# Patient Record
Sex: Female | Born: 1935 | Race: White | Hispanic: No | Marital: Married | Smoking: Former smoker
Health system: Southern US, Community
[De-identification: ages and names within clinical notes are randomized; demographics above are authoritative.]

## PROBLEM LIST (undated history)

## (undated) DIAGNOSIS — I10 Essential (primary) hypertension: Secondary | ICD-10-CM

## (undated) DIAGNOSIS — B019 Varicella without complication: Secondary | ICD-10-CM

## (undated) DIAGNOSIS — Z87440 Personal history of urinary (tract) infections: Secondary | ICD-10-CM

## (undated) DIAGNOSIS — E079 Disorder of thyroid, unspecified: Secondary | ICD-10-CM

## (undated) DIAGNOSIS — M8580 Other specified disorders of bone density and structure, unspecified site: Secondary | ICD-10-CM

## (undated) DIAGNOSIS — G4700 Insomnia, unspecified: Secondary | ICD-10-CM

## (undated) DIAGNOSIS — M87029 Idiopathic aseptic necrosis of unspecified humerus: Secondary | ICD-10-CM

## (undated) DIAGNOSIS — J301 Allergic rhinitis due to pollen: Secondary | ICD-10-CM

## (undated) DIAGNOSIS — M4120 Other idiopathic scoliosis, site unspecified: Secondary | ICD-10-CM

## (undated) DIAGNOSIS — I2699 Other pulmonary embolism without acute cor pulmonale: Secondary | ICD-10-CM

## (undated) DIAGNOSIS — F5102 Adjustment insomnia: Secondary | ICD-10-CM

## (undated) DIAGNOSIS — F23 Brief psychotic disorder: Secondary | ICD-10-CM

## (undated) DIAGNOSIS — F419 Anxiety disorder, unspecified: Secondary | ICD-10-CM

## (undated) HISTORY — DX: Varicella without complication: B01.9

## (undated) HISTORY — PX: APPENDECTOMY: SHX54

## (undated) HISTORY — DX: Allergic rhinitis due to pollen: J30.1

## (undated) HISTORY — PX: OTHER SURGICAL HISTORY: SHX169

## (undated) HISTORY — DX: Essential (primary) hypertension: I10

## (undated) HISTORY — PX: REPAIR ZENKER'S DIVERTICULA: SUR1212

## (undated) HISTORY — PX: TUBAL LIGATION: SHX77

## (undated) HISTORY — DX: Personal history of urinary (tract) infections: Z87.440

## (undated) HISTORY — PX: TONSILLECTOMY AND ADENOIDECTOMY: SHX28

---

## 2011-12-22 ENCOUNTER — Ambulatory Visit: Payer: Self-pay | Admitting: Internal Medicine

## 2011-12-29 ENCOUNTER — Ambulatory Visit: Payer: Self-pay | Admitting: Internal Medicine

## 2012-01-04 DIAGNOSIS — M8580 Other specified disorders of bone density and structure, unspecified site: Secondary | ICD-10-CM | POA: Insufficient documentation

## 2012-05-17 DIAGNOSIS — I872 Venous insufficiency (chronic) (peripheral): Secondary | ICD-10-CM | POA: Insufficient documentation

## 2012-12-27 ENCOUNTER — Ambulatory Visit: Payer: Self-pay | Admitting: Internal Medicine

## 2013-03-22 ENCOUNTER — Ambulatory Visit: Payer: Self-pay | Admitting: Gastroenterology

## 2013-03-23 LAB — PATHOLOGY REPORT

## 2014-01-02 ENCOUNTER — Ambulatory Visit: Payer: Self-pay | Admitting: Internal Medicine

## 2014-10-16 ENCOUNTER — Ambulatory Visit: Payer: Self-pay | Admitting: Internal Medicine

## 2014-12-24 ENCOUNTER — Encounter: Payer: Self-pay | Admitting: Emergency Medicine

## 2014-12-24 ENCOUNTER — Emergency Department
Admission: EM | Admit: 2014-12-24 | Discharge: 2014-12-25 | Disposition: A | Discharge status: Home / Self Care | Payer: Medicare Other | Attending: Emergency Medicine | Admitting: Emergency Medicine

## 2014-12-24 ENCOUNTER — Emergency Department: Payer: Medicare Other

## 2014-12-24 DIAGNOSIS — Y9289 Other specified places as the place of occurrence of the external cause: Secondary | ICD-10-CM | POA: Diagnosis not present

## 2014-12-24 DIAGNOSIS — Z87891 Personal history of nicotine dependence: Secondary | ICD-10-CM | POA: Insufficient documentation

## 2014-12-24 DIAGNOSIS — Y9301 Activity, walking, marching and hiking: Secondary | ICD-10-CM | POA: Insufficient documentation

## 2014-12-24 DIAGNOSIS — S61212A Laceration without foreign body of right middle finger without damage to nail, initial encounter: Secondary | ICD-10-CM | POA: Diagnosis not present

## 2014-12-24 DIAGNOSIS — Z23 Encounter for immunization: Secondary | ICD-10-CM | POA: Insufficient documentation

## 2014-12-24 DIAGNOSIS — W540XXA Bitten by dog, initial encounter: Secondary | ICD-10-CM | POA: Diagnosis not present

## 2014-12-24 DIAGNOSIS — Y998 Other external cause status: Secondary | ICD-10-CM | POA: Diagnosis not present

## 2014-12-24 DIAGNOSIS — S61451A Open bite of right hand, initial encounter: Secondary | ICD-10-CM | POA: Diagnosis present

## 2014-12-24 DIAGNOSIS — Z880 Allergy status to penicillin: Secondary | ICD-10-CM | POA: Insufficient documentation

## 2014-12-24 MED ORDER — ACETAMINOPHEN-CODEINE #3 300-30 MG PO TABS: 1.0000 | ORAL_TABLET | Freq: Four times a day (QID) | ORAL | Status: DC | PRN

## 2014-12-24 MED ORDER — DOXYCYCLINE HYCLATE 100 MG PO TABS
100.0000 mg | ORAL_TABLET | Freq: Once | ORAL | Status: AC
  Administered 2014-12-24: 100 mg via ORAL

## 2014-12-24 MED ORDER — DOXYCYCLINE HYCLATE 100 MG PO TABS
ORAL_TABLET | ORAL | Status: AC
  Administered 2014-12-24: 100 mg via ORAL
  Filled 2014-12-24: qty 1

## 2014-12-24 MED ORDER — CLINDAMYCIN HCL 150 MG PO CAPS
ORAL_CAPSULE | ORAL | Status: AC
  Filled 2014-12-24: qty 2

## 2014-12-24 MED ORDER — METRONIDAZOLE 500 MG PO TABS: 500.0000 mg | ORAL_TABLET | Freq: Three times a day (TID) | ORAL | Status: DC

## 2014-12-24 MED ORDER — DOXYCYCLINE HYCLATE 100 MG PO TABS
100.0000 mg | ORAL_TABLET | Freq: Two times a day (BID) | ORAL | Status: DC
Start: 2014-12-24 — End: 2017-04-14

## 2014-12-24 MED ORDER — TETANUS-DIPHTH-ACELL PERTUSSIS 5-2.5-18.5 LF-MCG/0.5 IM SUSP
INTRAMUSCULAR | Status: AC
  Administered 2014-12-24: 0.5 mL via INTRAMUSCULAR
  Filled 2014-12-24: qty 0.5

## 2014-12-24 MED ORDER — METRONIDAZOLE 250 MG PO TABS
500.0000 mg | ORAL_TABLET | Freq: Three times a day (TID) | ORAL | Status: DC
  Administered 2014-12-24: 500 mg via ORAL

## 2014-12-24 MED ORDER — TETANUS-DIPHTH-ACELL PERTUSSIS 5-2.5-18.5 LF-MCG/0.5 IM SUSP
0.5000 mL | Freq: Once | INTRAMUSCULAR | Status: AC
  Administered 2014-12-24: 0.5 mL via INTRAMUSCULAR

## 2014-12-24 MED ORDER — ACETAMINOPHEN-CODEINE #3 300-30 MG PO TABS
1.0000 | ORAL_TABLET | Freq: Three times a day (TID) | ORAL | Status: DC | PRN
  Administered 2014-12-25: 1 via ORAL

## 2014-12-24 MED ORDER — CLINDAMYCIN HCL 150 MG PO CAPS: 300.0000 mg | ORAL_CAPSULE | Freq: Once | ORAL | Status: DC

## 2014-12-24 MED ORDER — METRONIDAZOLE 250 MG PO TABS
ORAL_TABLET | ORAL | Status: AC
  Administered 2014-12-24: 500 mg via ORAL
  Filled 2014-12-24: qty 2

## 2014-12-24 NOTE — ED Provider Notes (Signed)
[facility] Emergency Department Provider Note ____________________________________________  Time seen: 2236  I have reviewed the triage vital signs and the nursing notes.  HISTORY  Chief Complaint Animal Bite  HPI Shannon Page is a 81 y.o. female reports to the ED for evaluation and treatment of injury sustained to her right hand. She describes that about 30 minutes prior to arrival, she was walking her dog when a stranger's dog approached, and attacked her dog. In attempt to break up the fight she was inadvertently bitten on the right hand. She describes a laceration to the top of the right hand and to the side of the middle finger on the right hand. She denies any other injury and is unclear for current tetanus status.  History reviewed. No pertinent past medical history.  There are no active problems to display for this patient.  Past Surgical History  Procedure Laterality Date  . Appendectomy     Current Outpatient Rx  Name  Route  Sig  Dispense  Refill  . doxycycline (VIBRA-TABS) 100 MG tablet   Oral   Take 1 tablet (100 mg total) by mouth 2 (two) times daily.   20 tablet   0   . metroNIDAZOLE (FLAGYL) 500 MG tablet   Oral   Take 1 tablet (500 mg total) by mouth 3 (three) times daily.   30 tablet   0    Allergies Atenolol; Clindamycin/lincomycin; and Penicillins  History reviewed. No pertinent family history.  Social History History  Substance Use Topics  . Smoking status: Former Research scientist (life sciences)  . Smokeless tobacco: Not on file  . Alcohol Use: 0.6 oz/week    1 Glasses of wine per week   Review of Systems  Constitutional: Negative for fever. Eyes: Negative for visual changes. ENT: Negative for sore throat. Cardiovascular: Negative for chest pain. Respiratory: Negative for shortness of breath. Gastrointestinal: Negative for abdominal pain, vomiting and diarrhea. Genitourinary: Negative for dysuria. Musculoskeletal: Negative for back  pain. Skin: Negative for rash. Neurological: Negative for headaches, focal weakness or numbness. ____________________________________________  PHYSICAL EXAM:  VITAL SIGNS: ED Triage Vitals  Enc Vitals Group     BP 12/24/14 2126 138/84 mmHg     Pulse Rate 12/24/14 2126 99     Resp 12/24/14 2126 20     Temp 12/24/14 2126 98.1 F (36.7 C)     Temp Source 12/24/14 2126 Oral     SpO2 12/24/14 2126 93 %     Weight 12/24/14 2126 140 lb (63.504 kg)     Height 12/24/14 2126 5" (0.127 m)     Head Cir --      Peak Flow --      Pain Score 12/24/14 2129 0     Pain Loc --      Pain Edu? --      Excl. in [place]? --    Constitutional: Alert and oriented. Well appearing and in no distress. Eyes: Conjunctivae are normal. PERRL. Normal extraocular movements. ENT   Head: Normocephalic and atraumatic.   Nose: No congestion/rhinnorhea.   Mouth/Throat: Mucous membranes are moist.   Neck: No stridor. Hematological/Lymphatic/Immunilogical: No cervical lymphadenopathy. Cardiovascular: Normal rate, regular rhythm.  Respiratory: Normal respiratory effort.No wheezes/rales/rhonchi. Gastrointestinal: Soft and nontender. No distention. Musculoskeletal: Nontender with normal range of motion in all extremities.No lower extremity tenderness nor edema. Neurologic:  Normal speech and language. No gross focal neurologic deficits are appreciated. Skin:  Skin is warm, dry and intact, except for a circum-linear laceration over the  dorsal right hand. Local hematoma noted.  A 1 cm triangular flap is located at the lateral 3rd digit at the proximal phalanx.  Psychiatric: Mood and affect are normal. Patient exhibits appropriate insight and judgment. ____________________________________________   RADIOLOGY  Right Hand  IMPRESSION: Soft tissue changes consistent with the recent injury. No acute fracture is noted. ________________________________________  PROCEDURES  Flagyl 500 mg PO  Doxycycline 100 mg  PO  Tdap Booster  LACERATION REPAIR Performed by: Melvenia Needles Authorized by: Melvenia Needles Consent: Verbal consent obtained. Risks and benefits: risks, benefits and alternatives were discussed Consent given by: patient Patient identity confirmed: provided demographic data Prepped and Draped in normal sterile fashion Wound explored  Laceration Location: dorsal right hand; middle finger  Laceration Length: 2.5cm; 1cm  No Foreign Bodies seen or palpated  Anesthesia: local infiltration  Local anesthetic: lidocaine 1% w/epinephrine  Anesthetic total: 2 ml  Irrigation method: syringe Amount of cleaning: standard  Skin closure: 5-0 nylon  Number of sutures: #3 / #2  Technique: loose interrupted  Patient tolerance: Patient tolerated the procedure well with no immediate complications. ____________________________________________  INITIAL IMPRESSION / ASSESSMENT AND PLAN / ED COURSE  Dog bite to the hand without x-ray evidence of fracture. Large dorsal skin flap approximated loosely with sutures. Antibiotic therapy initiated with Flagyl and Doxycycline due to her multiple drug allergies. Patient sent home with prescriptions for the same, Tylenol #3, and wound care instructions.  Patient to return for wound check as needed and suture removal in 1 week.   ____________________________________________  FINAL CLINICAL IMPRESSION(S) / ED DIAGNOSES  Final diagnoses:  Dog bite of hand without complication, right, initial encounter     Melvenia Needles, PA-C 12/24/14 2343  Dannielle Karvonen [place], PA-C 12/24/14 2345  Ahmed Prima, MD 12/27/14 1556

## 2014-12-24 NOTE — ED Notes (Signed)
Pt arrived to ED accompanied by a friend after she sustained a dog bite about 34min ago. Pt states that she was walking her dog and another dog came to attack her dog, when she tried to break the fight she got bit by the strangers dog. Pt is AOx4 in no apparent distress, no bleeding during triage.

## 2014-12-24 NOTE — Discharge Instructions (Signed)
Animal Bite °An animal bite can result in a scratch on the skin, deep open cut, puncture of the skin, crush injury, or tearing away of the skin or a body part. Dogs are responsible for most animal bites. Children are bitten more often than adults. An animal bite can range from very mild to more serious. A small bite from your house pet is no cause for alarm. However, some animal bites can become infected or injure a bone or other tissue. You must seek medical care if: °· The skin is broken and bleeding does not slow down or stop after 15 minutes. °· The puncture is deep and difficult to clean (such as a cat bite). °· Pain, warmth, redness, or pus develops around the wound. °· The bite is from a stray animal or rodent. There may be a risk of rabies infection. °· The bite is from a snake, raccoon, skunk, fox, coyote, or bat. There may be a risk of rabies infection. °· The person bitten has a chronic illness such as diabetes, liver disease, or cancer, or the person takes medicine that lowers the immune system. °· There is concern about the location and severity of the bite. °It is important to clean and protect an animal bite wound right away to prevent infection. Follow these steps: °· Clean the wound with plenty of water and soap. °· Apply an antibiotic cream. °· Apply gentle pressure over the wound with a clean towel or gauze to slow or stop bleeding. °· Elevate the affected area above the heart to help stop any bleeding. °· Seek medical care. Getting medical care within 8 hours of the animal bite leads to the best possible outcome. °DIAGNOSIS  °Your caregiver will most likely: °· Take a detailed history of the animal and the bite injury. °· Perform a wound exam. °· Take your medical history. °Blood tests or X-rays may be performed. Sometimes, infected bite wounds are cultured and sent to a lab to identify the infectious bacteria.  °TREATMENT  °Medical treatment will depend on the location and type of animal bite as  well as the patient's medical history. Treatment may include: °· Wound care, such as cleaning and flushing the wound with saline solution, bandaging, and elevating the affected area. °· Antibiotics. °· Tetanus immunization. °· Rabies immunization. °· Leaving the wound open to heal. This is often done with animal bites, due to the high risk of infection. However, in certain cases, wound closure with stitches, wound adhesive, skin adhesive strips, or staples may be used. ° Infected bites that are left untreated may require intravenous (IV) antibiotics and surgical treatment in the hospital. °HOME CARE INSTRUCTIONS °· Follow your caregiver's instructions for wound care. °· Take all medicines as directed. °· If your caregiver prescribes antibiotics, take them as directed. Finish them even if you start to feel better. °· Follow up with your caregiver for further exams or immunizations as directed. °You may need a tetanus shot if: °· You cannot remember when you had your last tetanus shot. °· You have never had a tetanus shot. °· The injury broke your skin. °If you get a tetanus shot, your arm may swell, get red, and feel warm to the touch. This is common and not a problem. If you need a tetanus shot and you choose not to have one, there is a rare chance of getting tetanus. Sickness from tetanus can be serious. °SEEK MEDICAL CARE IF: °· You notice warmth, redness, soreness, swelling, pus discharge, or a bad   smell coming from the wound.  You have a red line on the skin coming from the wound.  You have a fever, chills, or a general ill feeling.  You have nausea or vomiting.  You have continued or worsening pain.  You have trouble moving the injured part.  You have other questions or concerns. MAKE SURE YOU:  Understand these instructions.  Will watch your condition.  Will get help right away if you are not doing well or get worse. Document Released: 03/24/2011 Document Revised: 09/28/2011 Document  Reviewed: 03/24/2011 [facility] Patient Information 2015 [place], [place]. This information is not intended to replace advice given to you by your health care provider. Make sure you discuss any questions you have with your health care provider.  Laceration Care, Adult A laceration is a cut that goes through all layers of the skin. The cut goes into the tissue beneath the skin. HOME CARE For stitches (sutures) or staples:  Keep the cut clean and dry.  If you have a bandage (dressing), change it at least once a day. Change the bandage if it gets wet or dirty, or as told by your doctor.  Wash the cut with soap and water 2 times a day. Rinse the cut with water. Pat it dry with a clean towel.  Put a thin layer of medicated cream on the cut as told by your doctor.  You may shower after the first 24 hours. Do not soak the cut in water until the stitches are removed.  Only take medicines as told by your doctor.  Have your stitches or staples removed as told by your doctor. For skin adhesive strips:  Keep the cut clean and dry.  Do not get the strips wet. You may take a bath, but be careful to keep the cut dry.  If the cut gets wet, pat it dry with a clean towel.  The strips will fall off on their own. Do not remove the strips that are still stuck to the cut. For wound glue:  You may shower or take baths. Do not soak or scrub the cut. Do not swim. Avoid heavy sweating until the glue falls off on its own. After a shower or bath, pat the cut dry with a clean towel.  Do not put medicine on your cut until the glue falls off.  If you have a bandage, do not put tape over the glue.  Avoid lots of sunlight or tanning lamps until the glue falls off. Put sunscreen on the cut for the first year to reduce your scar.  The glue will fall off on its own. Do not pick at the glue. You may need a tetanus shot if:  You cannot remember when you had your last tetanus shot.  You have never had a tetanus  shot. If you need a tetanus shot and you choose not to have one, you may get tetanus. Sickness from tetanus can be serious. GET HELP RIGHT AWAY IF:   Your pain does not get better with medicine.  Your arm, hand, leg, or foot loses feeling (numbness) or changes color.  Your cut is bleeding.  Your joint feels weak, or you cannot use your joint.  You have painful lumps on your body.  Your cut is red, puffy (swollen), or painful.  You have a red line on the skin near the cut.  You have yellowish-white fluid (pus) coming from the cut.  You have a fever.  You have a bad smell  coming from the cut or bandage.  Your cut breaks open before or after stitches are removed.  You notice something coming out of the cut, such as wood or glass.  You cannot move a finger or toe. MAKE SURE YOU:   Understand these instructions.  Will watch your condition.  Will get help right away if you are not doing well or get worse. Document Released: 12/23/2007 Document Revised: 09/28/2011 Document Reviewed: 12/30/2010 [facility] Patient Information 2015 [place], [place]. This information is not intended to replace advice given to you by your health care provider. Make sure you discuss any questions you have with your health care provider.   Keep the wound clean, dry, and covered.  Take the prescription medicines as directed until completely gone.  Apply cool compresses to reduce swelling.  Follow-up with your provider or return as needed for wound checks.  Return to the ED in 1 week for suture removal.

## 2014-12-24 NOTE — ED Notes (Signed)
[employer] Officer working in ED notified of dog and will follow up while patient in ED

## 2014-12-25 MED ORDER — ACETAMINOPHEN-CODEINE #3 300-30 MG PO TABS
ORAL_TABLET | ORAL | Status: AC
  Administered 2014-12-25: 1 via ORAL
  Filled 2014-12-25: qty 1

## 2015-02-11 ENCOUNTER — Ambulatory Visit: Payer: Self-pay | Admitting: Podiatry

## 2015-10-01 ENCOUNTER — Other Ambulatory Visit: Payer: Self-pay | Admitting: Internal Medicine

## 2015-10-01 DIAGNOSIS — Z1231 Encounter for screening mammogram for malignant neoplasm of breast: Secondary | ICD-10-CM

## 2015-10-10 ENCOUNTER — Other Ambulatory Visit: Payer: Self-pay | Admitting: Internal Medicine

## 2015-10-10 ENCOUNTER — Ambulatory Visit
Admission: RE | Admit: 2015-10-10 | Discharge: 2015-10-10 | Disposition: A | Discharge status: Home / Self Care | Payer: Medicare Other | Source: Ambulatory Visit | Attending: Internal Medicine | Admitting: Internal Medicine

## 2015-10-10 DIAGNOSIS — Z1231 Encounter for screening mammogram for malignant neoplasm of breast: Secondary | ICD-10-CM | POA: Insufficient documentation

## 2016-10-05 ENCOUNTER — Other Ambulatory Visit: Payer: Self-pay | Admitting: Internal Medicine

## 2016-10-05 DIAGNOSIS — Z1231 Encounter for screening mammogram for malignant neoplasm of breast: Secondary | ICD-10-CM

## 2016-11-05 ENCOUNTER — Ambulatory Visit
Admission: RE | Admit: 2016-11-05 | Discharge: 2016-11-05 | Disposition: A | Discharge status: Home / Self Care | Payer: Medicare Other | Source: Ambulatory Visit | Attending: Internal Medicine | Admitting: Internal Medicine

## 2016-11-05 DIAGNOSIS — Z1231 Encounter for screening mammogram for malignant neoplasm of breast: Secondary | ICD-10-CM | POA: Diagnosis not present

## 2017-04-14 ENCOUNTER — Ambulatory Visit (INDEPENDENT_AMBULATORY_CARE_PROVIDER_SITE_OTHER): Payer: Medicare Other | Admitting: Podiatry

## 2017-04-14 ENCOUNTER — Encounter: Payer: Self-pay | Admitting: Podiatry

## 2017-04-14 DIAGNOSIS — Q828 Other specified congenital malformations of skin: Secondary | ICD-10-CM | POA: Diagnosis not present

## 2017-04-14 NOTE — Progress Notes (Signed)
  Subjective:  Patient ID: Shannon Page, female    DOB: 04-Nov-1933,  MRN: [id number] HPI Chief Complaint  Patient presents with  . Foot Pain    Sub 1st MPJ left - tender, callused area x 3 months, no treatment    83 y.o. female presents with the above complaint.    No past medical history on file. Past Surgical History:  Procedure Laterality Date  . APPENDECTOMY      Current Outpatient Prescriptions:  .  Calcium-Vitamin D-Vitamin K (VIACTIV PO), Take by mouth., Disp: , Rfl:  .  Omega-3 Fatty Acids (FISH OIL PO), Take by mouth., Disp: , Rfl:  .  aspirin EC 81 MG tablet, Take by mouth., Disp: , Rfl:   Allergies  Allergen Reactions  . Atenolol     Dizziness   . Avelox [Moxifloxacin Hcl In Nacl]   . Clindamycin/Lincomycin Rash  . Nystatin Rash  . Penicillins Rash   Review of Systems  All other systems reviewed and are negative.  Objective:  There were no vitals filed for this visit.  General: Well developed, nourished, in no acute distress, alert and oriented x3   Dermatological: Skin is warm, dry and supple bilateral. Nails x 10 are well maintained; remaining integument appears unremarkable at this time. There are no open sores, no preulcerative lesions, no rash or signs of infection present.Soft tissue lesion benign reactive hyperkeratosis plantar aspect first metatarsophalangeal joint left foot.  Vascular: Dorsalis Pedis artery and Posterior Tibial artery pedal pulses are 2/4 bilateral with immedate capillary fill time. Pedal hair growth present. No varicosities and no lower extremity edema present bilateral.   Neruologic: Grossly intact via light touch bilateral. Vibratory intact via tuning fork bilateral. Protective threshold with Semmes Wienstein monofilament intact to all pedal sites bilateral. Patellar and Achilles deep tendon reflexes 2+ bilateral. No Babinski or clonus noted bilateral.   Musculoskeletal: No gross boney pedal deformities bilateral. No pain,  crepitus, or limitation noted with foot and ankle range of motion bilateral. Muscular strength 5/5 in all groups tested bilateral. Severe hallux abductovalgus deformities bilateral. Minor severe hammertoe deformities.  Gait: Unassisted, Nonantalgic.    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  Plan: Debrided soft tissue lesion benign cell first left.     Max T. Cash, [place]

## 2017-08-13 ENCOUNTER — Other Ambulatory Visit: Payer: Self-pay | Admitting: Otolaryngology

## 2017-08-13 DIAGNOSIS — R1310 Dysphagia, unspecified: Secondary | ICD-10-CM

## 2017-08-13 DIAGNOSIS — Z8719 Personal history of other diseases of the digestive system: Secondary | ICD-10-CM

## 2017-08-13 DIAGNOSIS — R05 Cough: Secondary | ICD-10-CM

## 2017-08-13 DIAGNOSIS — R059 Cough, unspecified: Secondary | ICD-10-CM

## 2017-08-13 DIAGNOSIS — Z9889 Other specified postprocedural states: Secondary | ICD-10-CM

## 2017-08-13 DIAGNOSIS — R490 Dysphonia: Secondary | ICD-10-CM

## 2017-09-02 ENCOUNTER — Ambulatory Visit
Admission: RE | Admit: 2017-09-02 | Discharge: 2017-09-02 | Disposition: A | Discharge status: Home / Self Care | Payer: Medicare Other | Source: Ambulatory Visit | Attending: Otolaryngology | Admitting: Otolaryngology

## 2017-09-02 DIAGNOSIS — Z9889 Other specified postprocedural states: Secondary | ICD-10-CM | POA: Diagnosis not present

## 2017-09-02 DIAGNOSIS — R05 Cough: Secondary | ICD-10-CM | POA: Diagnosis present

## 2017-09-02 DIAGNOSIS — R1310 Dysphagia, unspecified: Secondary | ICD-10-CM | POA: Insufficient documentation

## 2017-09-02 DIAGNOSIS — Z8719 Personal history of other diseases of the digestive system: Secondary | ICD-10-CM | POA: Diagnosis not present

## 2017-09-02 DIAGNOSIS — R490 Dysphonia: Secondary | ICD-10-CM | POA: Insufficient documentation

## 2017-09-02 DIAGNOSIS — R059 Cough, unspecified: Secondary | ICD-10-CM

## 2017-09-02 NOTE — Therapy (Addendum)
[address] [place], [place], [address] Phone: [phone]   Fax:     Modified Barium Swallow  Patient Details  Name: Shannon Page MRN: [id number] Date of Birth: 01-Aug-1933 No Data Recorded  Encounter Date: 09/02/2017  End of Session - 09/02/17 1522    Visit Number  1    Number of Visits  1    Date for SLP Re-Evaluation  09/02/17    SLP Start Time  1300    SLP Stop Time   1400    SLP Time Calculation (min)  60 min    Activity Tolerance  Patient tolerated treatment well       No past medical history on file.  Past Surgical History:  Procedure Laterality Date  . APPENDECTOMY      There were no vitals filed for this visit.       Subjective: Patient behavior: (alertness, ability to follow instructions, etc.): pt alert, verbally conversive and engaged easily w/ SLP. 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 Pt denied any Pulmonary history or pneumonia. She eats a regular diet; native dentition, 1-2 missing.  Chief complaint: dysphagia intermittently   Objective:  Radiological Procedure: A videoflouroscopic evaluation of oral-preparatory, reflex initiation, and pharyngeal phases of the swallow was performed; as well as a screening of the upper esophageal phase.  I. POSTURE: upright   II. VIEW: lateral III. COMPENSATORY STRATEGIES: f/u, dry swallow intermittently b/t trials to aid clearing the Esophagus, any trace oropharyngeal residue(wfl) IV. BOLUSES ADMINISTERED:  Thin Liquid: 3 trials; 3 multiple sips trial  Nectar-thick Liquid: 2 trials  Honey-thick Liquid: NT  Puree: 3 trials  Mechanical Soft: 2 trials V. RESULTS OF EVALUATION: A. ORAL PREPARATORY PHASE: (The lips, tongue, and velum are observed for strength and coordination)       **Overall Severity Rating: WFL. timely bolus management and A-P transfer  w/ all boluses; oral clearing was appropriate w/ all consistencies - pt exhibited an independent, dry swallow b/t trials which cleared any trace bolus residue noted inconsistently w/ bolus consistencies given. No OM weakness or discoordination w/ bolus control.  B. SWALLOW INITIATION/REFLEX: (The reflex is normal if "triggered" by the time the bolus reached the base of the tongue)  **Overall Severity Rating: [facility]. pharyngeal swallow initiation was timely(for pt's age) at [facility] for trials w/ timely and appropriate airway closure noted during the swallow. No laryngeal penetration or aspiration occurred.  C. PHARYNGEAL PHASE: (Pharyngeal function is normal if the bolus shows rapid, smooth, and continuous transit through the pharynx and there is no pharyngeal residue after the swallow)  **Overall Severity Rating: [facility]. Adequate pharyngeal clearing of all bolus consistencies was noted - no significant pharyngeal residue remained. This indicates adequate laryngeal excursion and pharyngeal pressure during the swallow.  D. LARYNGEAL PENETRATION: (Material entering into the laryngeal inlet/vestibule but not aspirated): NONE  E. ASPIRATION: NONE  F.   ESOPHAGEAL PHASE: (Screening of the upper esophagus): slight-min bolus residue remained in the cervical Esophagus along the posterior wall - Radiologist noted a mild deformity of the F. posterior aspect of the upper esophagus. This is most likely postsurgical of the Zenker's Diverticulum (repaired). Bolus stasis appeared to remain briefly but would reduce and/or clear w/ a f/u, dry swallow or w/ alternating  food/liquid boluses. Retrograde bolus activity noted w/ trials of liquids, purees more distal in the Esophagus.  ASSESSMENT: Pt appears to present w/ appropriate oropharyngeal phase swallow functioning w/ no physiological or gross oral motor weakness noted during the swallow; no significant oropharyngeal phase dysphagia noted. Pt is at reduced risk for aspiration from an oropharyngeal phase standpoint. During the pharyngeal phase, the pharyngeal swallow initiation was timely(for pt's age) at [facility] for trials w/ timely and appropriate airway closure during the swallow. No laryngeal penetration or aspiration occurred. Adequate pharyngeal clearing of all bolus consistencies was noted - no significant pharyngeal residue remained. This indicates adequate laryngeal excursion and pharyngeal pressure during the swallow. Any trace bolus residue cleared w/ an independent, f/u dry swallow. During the oral phase, pt exhibited timely bolus management and A-P transfer w/ all boluses; oral clearing was appropriate w/ all consistencies - pt exhibited an independent, dry swallow b/t trials which cleared any trace bolus residue noted inconsistently w/ bolus consistencies given. No OM weakness or discoordination w/ bolus control noted. During the Esophageal phase, a slight-min amount of bolus residue remained in the cervical Esophagus along the posterior wall - Radiologist noted a mild deformity of the posterior aspect of the Upper Esophagus. This is most likely postsurgical of the Zenker's Diverticulum (repaired), per Radiologist. Bolus stasis appeared to remain briefly but would reduce and/or clear w/ a f/u, dry swallow or w/ alternating food/liquid boluses. Also noted retrograde bolus activity w/ trials of liquids, purees more distal in the Esophagus. BOTH of these issues/presentations could be related to pt's c/o the "feeling" of something remaining in her throat; impact from the dysmotility and reduced clearing of  bolus material effeciently through the Esophageal phase. Any Retrograde bolus activity could increase risk for material to move back into the pharynx and impact the airway safety, vocal quality.    PLAN/RECOMMENDATIONS:  A. Diet: Regular(foods cut small, moistened) diet consistency w/ Thin liquids;  Pills in Puree(Whole) if easier for swallowing/clearing the Esophagus  B. Swallowing Precautions: general aspiration precautions; general Reflux precautions  C. Recommended consultation to ENT or GI for further examination and management of impact on bolus motility in the Cervical Esophagus(Radiologist noted a mild deformity of the posterior aspect of the Upper Esophagus. This is most likely postsurgical of the Zenker's Diverticulum (repaired), per Radiologist report). Also GI management and potential need for direct view d/t Esophageal dysmotility more distally in the Esophagus as Retrograde activity was noted during trials.   D. Therapy recommendations: no skilled services indicated  E. Results and recommendations were discussed w/ patient and husband; video viewed and questions/recommendations discussed.             Dysphagia, unspecified type - Plan: DG OP Swallowing Func-Medicare/Speech Path, DG OP Swallowing Func-Medicare/Speech Path  Cough - Plan: DG OP Swallowing Func-Medicare/Speech Path, DG OP Swallowing Func-Medicare/Speech Path  Hoarseness - Plan: DG OP Swallowing Func-Medicare/Speech Path, DG OP Swallowing Func-Medicare/Speech Path  Hx of excision of Zenker's diverticulum - Plan: DG OP Swallowing Func-Medicare/Speech Path, DG OP Swallowing Func-Medicare/Speech Path  G-Codes - 09-18-17 1523    Functional Assessment Tool Used  clinical judgement    Functional Limitations  Swallowing    Swallow Current Status [phone])  At least 1 percent but less than 20 percent impaired, limited or restricted    Swallow Goal Status ([id number])  At least 1 percent but less than 20 percent impaired,  limited or restricted    Swallow Discharge Status [phone])  At least 1 percent but less than 20 percent impaired, limited or restricted           Problem List Patient Active Problem List   Diagnosis Date Noted  . Venous insufficiency, peripheral 05/17/2012  . Osteopenia 01/04/2012    Aarika Moon 09/02/2017, 3:24 PM  [facility] [place], [place], [address] Phone: [phone]   Fax:     Name: TONDALAYA PERREN MRN: [id number] Date of Birth: 1934-07-15

## 2017-09-27 ENCOUNTER — Other Ambulatory Visit: Payer: Self-pay | Admitting: Internal Medicine

## 2017-09-27 DIAGNOSIS — Z1231 Encounter for screening mammogram for malignant neoplasm of breast: Secondary | ICD-10-CM

## 2017-10-13 ENCOUNTER — Encounter: Payer: Self-pay | Admitting: Podiatry

## 2017-10-13 ENCOUNTER — Ambulatory Visit (INDEPENDENT_AMBULATORY_CARE_PROVIDER_SITE_OTHER): Payer: Medicare Other | Admitting: Podiatry

## 2017-10-13 DIAGNOSIS — Q828 Other specified congenital malformations of skin: Secondary | ICD-10-CM | POA: Diagnosis not present

## 2017-10-13 NOTE — Progress Notes (Signed)
She presents today chief complaint of painful callus sub-first metatarsal phalangeal joint left foot.  Objective: Vital signs are stable she is alert and oriented x3 pulses are palpable.  Severe hallux valgus deformities resulting in a very prominent first ray.  Reactive hyper keratoma is present sub-first metatarsal phalangeal joint left foot.  Assessment: Porokeratosis left.  Plan: Sharp debridement of porokeratotic lesion placed padding and will follow up with her on an as-needed basis.

## 2017-11-08 ENCOUNTER — Ambulatory Visit
Admission: RE | Admit: 2017-11-08 | Discharge: 2017-11-08 | Disposition: A | Discharge status: Home / Self Care | Payer: Medicare Other | Source: Ambulatory Visit | Attending: Internal Medicine | Admitting: Internal Medicine

## 2017-11-08 DIAGNOSIS — Z1231 Encounter for screening mammogram for malignant neoplasm of breast: Secondary | ICD-10-CM | POA: Insufficient documentation

## 2017-12-27 ENCOUNTER — Ambulatory Visit (INDEPENDENT_AMBULATORY_CARE_PROVIDER_SITE_OTHER): Payer: Medicare Other | Admitting: Podiatry

## 2017-12-27 ENCOUNTER — Encounter: Payer: Self-pay | Admitting: Podiatry

## 2017-12-27 DIAGNOSIS — Q828 Other specified congenital malformations of skin: Secondary | ICD-10-CM

## 2017-12-27 NOTE — Progress Notes (Signed)
She presents today chief complaint of painful callus sub-first metatarsophalangeal joint of the left foot.  She states that it feels like a walking on pins-and-needles with this callus under here.  Objective: Vital signs are stable she is alert and oriented x3.  Pulses are palpable.  She has severe hallux abductovalgus deformity with complete dislocation of the hallux in a valgus position and dorsally dislocated with amputation of second toe and portion of the metatarsal phalangeal joint.  Reactive hyperkeratotic lesion plantar aspect of the foot painful in nature.  Assessment: Pain in limb secondary porokeratosis.  Plan: Debrided the area today placed salicylic acid under occlusion to be left on for 3 days before getting wet.  Will wash this off follow-up with me in 6 weeks for another debridement.

## 2018-02-07 ENCOUNTER — Ambulatory Visit (INDEPENDENT_AMBULATORY_CARE_PROVIDER_SITE_OTHER): Payer: Medicare Other | Admitting: Podiatry

## 2018-02-07 ENCOUNTER — Encounter: Payer: Self-pay | Admitting: Podiatry

## 2018-02-07 DIAGNOSIS — Q828 Other specified congenital malformations of skin: Secondary | ICD-10-CM | POA: Diagnosis not present

## 2018-02-07 NOTE — Progress Notes (Signed)
She presents today for follow-up of her first metatarsophalangeal joint left.  States that seems to be doing better but I like to see by getting it trimmed once again.  Objective: Vital signs are stable she is alert and oriented x3 solitary porokeratotic lesion sub-first metatarsophalangeal joint of the left foot today.  Severe digital deformities resulting in reactive hyperkeratoses due to abnormal weightbearing.  Assessment: Porokeratosis left foot.  Plan: Debridement of reactive hyperkeratosis salicylic acid under occlusion be left on for 3 days then washed off thoroughly follow-up with me in 6 weeks if necessary.

## 2018-07-27 ENCOUNTER — Encounter: Payer: Self-pay | Admitting: Gastroenterology

## 2018-07-27 ENCOUNTER — Ambulatory Visit (INDEPENDENT_AMBULATORY_CARE_PROVIDER_SITE_OTHER): Payer: Medicare Other | Admitting: Gastroenterology

## 2018-07-27 VITALS — BP 131/87 | HR 90 | Ht 59.0 in | Wt 139.4 lb

## 2018-07-27 DIAGNOSIS — R1319 Other dysphagia: Secondary | ICD-10-CM | POA: Diagnosis not present

## 2018-07-27 NOTE — Progress Notes (Signed)
Shannon Page [address]  [place]  Shannon Page, Shannon Page [address]  Main: [phone]  Fax: [phone]   Gastroenterology Consultation  Referring Provider:     Carloyn Manner, MD Primary Care Physician:  Shannon Harrier, MD Primary Gastroenterologist:  Dr. Vonda Page Reason for Consultation:     Dysphagia        HPI:   Chief complaint: Dysphagia  Shannon Page is a 85 y.o. y/o female referred for consultation & management  by Dr. Tracie Harrier, MD.  Patient reports 2 episodes of the last 6 months of dysphagia that occurred once with a hot dog and the second time eating a waffle.  She thinks both times she may have taken too big a bite diet and not chewed her food well.  No difficulty with liquids.  No weight loss.  No nausea or vomiting.  No abdominal pain, no altered bowel habits, no melena or hematochezia.  Patient also reports a click sound intermittently when she swallows which is new for her.  Reports her symptoms at the time of her Zenker's diverticulum was dysphagia to liquids but not to solids and this has not reoccurred since her surgery 10 years ago.  Patient had evaluation by ENT and modified barium swallow as well.  As per speech pathology note: "Recommended consultation to ENT or GI for further examination and management of impact on bolus motility in the Cervical Esophagus(Radiologist noted amild deformity of theposterior aspect oftheUpperEsophagus. This is most likely postsurgicalof the Zenker's Diverticulum (repaired), per Radiologist report). Also GI management and potential need for direct view d/t Esophageal dysmotility more distally in the Esophagus as Retrograde activity was noted during trials."  ENT report states "patient also has tremor as well as mild glottic gap.  Doubt glottic gap is enough to cause aspiration.  Dysphonia-no significant reflux changes.  Mild tremulous quality to vocal cords... Previous Zenker's  repair but no  recurrence.  Some dysmotility that is most likely cause of patient's symptoms."  Patient reports family history of colon cancer in mother.  Last colonoscopy was in 2014 and 1 subcentimeter polyp was removed which was benign.  Past medical history: Zenker's diverticulum and surgery for the same  Past Surgical History:  Procedure Laterality Date  . APPENDECTOMY      Prior to Admission medications   Medication Sig Start Date End Date Taking? Authorizing Provider  aspirin EC 81 MG tablet Take by mouth.    [provider]  Calcium-Vitamin D-Vitamin K (VIACTIV PO) Take by mouth.    [provider]  hydrocortisone 2.5 % cream APPLY TO AFFECTED AREA ON THE SKIN TWICE A DAY TO FACE UNTIL CLEAR, THEN AS NEEDED FLARES 12/31/17   [provider]  OLANZapine (ZYPREXA) 7.5 MG tablet Take 7.5 mg by mouth at bedtime. 11/04/17   [provider]  Omega-3 Fatty Acids (FISH OIL PO) Take by mouth.    [provider]    Family History  Problem Relation Age of Onset  . Breast cancer Neg Hx      Social History   Tobacco Use  . Smoking status: Former Research scientist (life sciences)  . Smokeless tobacco: Never Used  Substance Use Topics  . Alcohol use: Yes    Alcohol/week: 1.0 standard drinks    Types: 1 Glasses of wine per week  . Drug use: No    Allergies as of 07/27/2018 - Review Complete 02/07/2018  Allergen Reaction Noted  . Moxifloxacin Other (See Comments) 05/17/2012  . Atenolol  12/24/2014  . Avelox [moxifloxacin hcl in nacl]  04/14/2017  . Clindamycin Rash 11/11/2011  . Clindamycin/lincomycin Rash 12/24/2014  . Nystatin Rash 10/07/2015  . Penicillins Rash 12/24/2014    Review of Systems:    All systems reviewed and negative except where noted in HPI.   Physical Exam:  There were no vitals taken for this visit. No LMP recorded. Patient is postmenopausal. Psych:  Alert and cooperative. Normal mood and affect. General:   Alert,  Well-developed, well-nourished,  pleasant and cooperative in NAD Head:  Normocephalic and atraumatic. Eyes:  Sclera clear, no icterus.   Conjunctiva pink. Ears:  Normal auditory acuity. Nose:  No deformity, discharge, or lesions. Mouth:  No deformity or lesions,oropharynx pink & moist. Neck:  Supple; no masses or thyromegaly. Abdomen:  Normal bowel sounds.  No bruits.  Soft, non-tender and non-distended without masses, hepatosplenomegaly or hernias noted.  No guarding or rebound tenderness.    Msk:  Symmetrical without gross deformities. Good, equal movement & strength bilaterally. Pulses:  Normal pulses noted. Extremities:  No clubbing or edema.  No cyanosis. Neurologic:  Alert and oriented x3;  grossly normal neurologically. Skin:  Intact without significant lesions or rashes. No jaundice. Lymph Nodes:  No significant cervical adenopathy. Psych:  Alert and cooperative. Normal mood and affect.   Labs: CBC No results found for: WBC, RBC, HGB, HCT, PLT, MCV, MCH, MCHC, RDW, LYMPHSABS, MONOABS, EOSABS, BASOSABS CMP  No results found for: NA, K, CL, CO2, GLUCOSE, BUN, CREATININE, CALCIUM, PROT, ALBUMIN, AST, ALT, ALKPHOS, BILITOT, GFRNONAA, GFRAA  Imaging Studies: No results found.  Assessment and Plan:   Shannon Page is a 85 y.o. y/o female has been referred for dysphagia with history of Zenker's diverticulum repair 10 years ago   we discussed the option of endoscopic evaluation of EGD to rule out esophageal sphincter and dilation if needed Patient would like to proceed with EGD Episodes of dysphagia only occurred twice with big bites only, patient has to chew her food well and eat in small bites and she verbalized understanding.  Dysphagia has not occurred outside of these 2 episodes. EGD would also help [place] evaluate the site of her Zenkers surgery  I discussed that the click sound she is referring to is unlikely to be coming from the esophagus and she should follow-up with ENT in this regard.  They noted a  glottic gap and tremulous activity and unclear if this could be leading to the clicking sound.  I have discussed alternative options, risks & benefits,  which include, but are not limited to, bleeding, infection, perforation,respiratory complication & drug reaction.  The patient agrees with this plan & written consent will be obtained.    As far as her family history of colon cancer is concerned, as per up-to-date "Although there is no direct evidence to guide when to end [place] screening among people with a family history, the Science writer (MISCAN)-Colorectal Cancer Model suggested that CRC screening should end at age 53 among persons with one FDR diagnosed after age 83, and end at age 48 for persons with two or more FDRs diagnosed before age 47".   Therefore, based on the above recommendation her colorectal cancer screening is recommended to and at 79 since she only has 1 first-degree relative with colon cancer.  We did discuss this with her and discussed that if she would like to proceed with another colonoscopy given her family history, since there are no specific guidelines in patients with family  history of colon cancer just recommendations like the one above, we can proceed with a colonoscopy along with her EGD but patient would not like one at this time.  Dr Shannon Page  Speech recognition software was used to dictate the above note.

## 2018-07-29 ENCOUNTER — Encounter: Payer: Self-pay | Admitting: *Deleted

## 2018-08-01 ENCOUNTER — Encounter
Admission: RE | Disposition: A | Discharge status: Home / Self Care | Payer: Self-pay | Source: Home / Self Care | Attending: Gastroenterology

## 2018-08-01 ENCOUNTER — Ambulatory Visit
Admission: RE | Admit: 2018-08-01 | Discharge: 2018-08-01 | Disposition: A | Discharge status: Home / Self Care | Payer: Medicare Other | Attending: Gastroenterology | Admitting: Gastroenterology

## 2018-08-01 ENCOUNTER — Ambulatory Visit: Payer: Medicare Other | Admitting: Anesthesiology

## 2018-08-01 ENCOUNTER — Other Ambulatory Visit: Payer: Self-pay

## 2018-08-01 DIAGNOSIS — K210 Gastro-esophageal reflux disease with esophagitis: Secondary | ICD-10-CM | POA: Insufficient documentation

## 2018-08-01 DIAGNOSIS — Z7982 Long term (current) use of aspirin: Secondary | ICD-10-CM | POA: Diagnosis not present

## 2018-08-01 DIAGNOSIS — Z79899 Other long term (current) drug therapy: Secondary | ICD-10-CM | POA: Diagnosis not present

## 2018-08-01 DIAGNOSIS — R1310 Dysphagia, unspecified: Secondary | ICD-10-CM | POA: Diagnosis present

## 2018-08-01 DIAGNOSIS — R1319 Other dysphagia: Secondary | ICD-10-CM | POA: Diagnosis not present

## 2018-08-01 DIAGNOSIS — F419 Anxiety disorder, unspecified: Secondary | ICD-10-CM | POA: Diagnosis not present

## 2018-08-01 DIAGNOSIS — K449 Diaphragmatic hernia without obstruction or gangrene: Secondary | ICD-10-CM | POA: Diagnosis not present

## 2018-08-01 DIAGNOSIS — Z87891 Personal history of nicotine dependence: Secondary | ICD-10-CM | POA: Diagnosis not present

## 2018-08-01 HISTORY — DX: Anxiety disorder, unspecified: F41.9

## 2018-08-01 HISTORY — PX: ESOPHAGOGASTRODUODENOSCOPY (EGD) WITH PROPOFOL: SHX5813

## 2018-08-01 SURGERY — ESOPHAGOGASTRODUODENOSCOPY (EGD) WITH PROPOFOL
Anesthesia: General

## 2018-08-01 MED ORDER — PROPOFOL 10 MG/ML IV BOLUS
INTRAVENOUS | Status: DC | PRN
  Administered 2018-08-01: 70 mg via INTRAVENOUS

## 2018-08-01 MED ORDER — SODIUM CHLORIDE 0.9 % IV SOLN
INTRAVENOUS | Status: DC
  Administered 2018-08-01: 1000 mL via INTRAVENOUS

## 2018-08-01 MED ORDER — PROPOFOL 500 MG/50ML IV EMUL
INTRAVENOUS | Status: DC | PRN
  Administered 2018-08-01: 150 ug/kg/min via INTRAVENOUS

## 2018-08-01 NOTE — Anesthesia Procedure Notes (Signed)
Date/Time: 08/01/2018 11:50 AM Performed by: Doreen Salvage, CRNA Pre-anesthesia Checklist: Patient identified, Emergency Drugs available, Suction available and Patient being monitored Patient Re-evaluated:Patient Re-evaluated prior to induction Oxygen Delivery Method: Nasal cannula Induction Type: IV induction Dental Injury: Teeth and Oropharynx as per pre-operative assessment  Comments: Nasal cannula with etCO2 monitoring

## 2018-08-01 NOTE — Transfer of Care (Signed)
Immediate Anesthesia Transfer of Care Note  Patient: Shannon Page  Procedure(s) Performed: Procedure(s): ESOPHAGOGASTRODUODENOSCOPY (EGD) WITH PROPOFOL (N/A)  Patient Location: PACU and Endoscopy Unit  Anesthesia Type:General  Level of Consciousness: sedated  Airway & Oxygen Therapy: Patient Spontanous Breathing and Patient connected to nasal cannula oxygen  Post-op Assessment: Report given to RN and Post -op Vital signs reviewed and stable  Post vital signs: Reviewed and stable  Last Vitals:  Vitals:   08/01/18 1056 08/01/18 1210  BP: (!) 171/97 105/67  Pulse: 100   Resp: 16 19  Temp: (!) 36.4 C (!) 36.1 C  SpO2: 01% 09%    Complications: No apparent anesthesia complications

## 2018-08-01 NOTE — Anesthesia Preprocedure Evaluation (Addendum)
Anesthesia Evaluation  Patient identified by MRN, date of birth, ID band Patient awake    Reviewed: Allergy & Precautions, H&P , NPO status , Patient's Chart, lab work & pertinent test results  Airway Mallampati: III       Dental  (+) Chipped   Pulmonary neg COPD, neg recent URI, former smoker,           Cardiovascular (-) angina(-) Past MI and (-) Cardiac Stents negative cardio ROS  (-) dysrhythmias      Neuro/Psych PSYCHIATRIC DISORDERS Anxiety negative neurological ROS     GI/Hepatic negative GI ROS, Neg liver ROS,   Endo/Other  negative endocrine ROS  Renal/GU negative Renal ROS  negative genitourinary   Musculoskeletal   Abdominal   Peds  Hematology negative hematology ROS (+)   Anesthesia Other Findings Past Medical History: No date: Anxiety  Past Surgical History: No date: APPENDECTOMY  BMI    Body Mass Index:  28.07 kg/m      Reproductive/Obstetrics negative OB ROS                            Anesthesia Physical Anesthesia Plan  ASA: II  Anesthesia Plan: General   Post-op Pain Management:    Induction:   PONV Risk Score and Plan: Propofol infusion and TIVA  Airway Management Planned: Natural Airway and Nasal Cannula  Additional Equipment:   Intra-op Plan:   Post-operative Plan:   Informed Consent: I have reviewed the patients History and Physical, chart, labs and discussed the procedure including the risks, benefits and alternatives for the proposed anesthesia with the patient or authorized representative who has indicated his/her understanding and acceptance.   Dental Advisory Given  Plan Discussed with: Anesthesiologist  Anesthesia Plan Comments:         Anesthesia Quick Evaluation

## 2018-08-01 NOTE — Anesthesia Post-op Follow-up Note (Signed)
Anesthesia QCDR form completed.        

## 2018-08-01 NOTE — Op Note (Signed)
[facility] Gastroenterology Patient Name: Shannon Page Procedure Date: 08/01/2018 11:43 AM MRN: [id number] Account #: [id number] Date of Birth: 1934-03-04 Admit Type: Outpatient Age: 85 Room: [facility] ENDO ROOM 4 Gender: Female Note Status: Finalized Procedure:            Upper GI endoscopy Indications:          Dysphagia Providers:            Skyra Crichlow B. Bonna Gains MD, MD Medicines:            Monitored Anesthesia Care Complications:        No immediate complications. Procedure:            Pre-Anesthesia Assessment:                       - Prior to the procedure, a History and Physical was                        performed, and patient medications, allergies and                        sensitivities were reviewed. The patient's tolerance of                        previous anesthesia was reviewed.                       - The risks and benefits of the procedure and the                        sedation options and risks were discussed with the                        patient. All questions were answered and informed                        consent was obtained.                       - Patient identification and proposed procedure were                        verified prior to the procedure by the physician, the                        nurse, the anesthesiologist, the anesthetist and the                        technician. The procedure was verified in the procedure                        room.                       - ASA Grade Assessment: II - A patient with mild                        systemic disease.                       After obtaining informed consent, the endoscope was  passed under direct vision. Throughout the procedure,                        the patient's blood pressure, pulse, and oxygen                        saturations were monitored continuously. The Endoscope                        was introduced through the mouth, and advanced to the                      second part of duodenum. The upper GI endoscopy was                        accomplished with ease. The patient tolerated the                        procedure well. Findings:      The examined esophagus was normal. Biopsies were obtained from the       proximal and distal esophagus with cold forceps for histology of       suspected eosinophilic esophagitis.      There is no endoscopic evidence of stenosis, stricture or mass in the       entire esophagus.      A small hiatal hernia was present.      The entire examined stomach was normal.      Brown minute amount of liquid material seen in the stomach. Cleaned with       water and suction with no lesions or no bleeding seen underneath.      The duodenal bulb, second portion of the duodenum and examined duodenum       were normal. Impression:           - Normal esophagus. Biopsied.                       - Small hiatal hernia.                       - Normal stomach.                       - Brown minute amount of liquid material seen in the                        stomach. Cleaned with water and suction with no lesions                        or no bleeding seen underneath.                       - Normal duodenal bulb, second portion of the duodenum                        and examined duodenum. Recommendation:       - Await pathology results.                       - Discharge patient to home (with escort).                       -  Advance diet as tolerated.                       - Continue present medications.                       - Patient has a contact number available for                        emergencies. The signs and symptoms of potential                        delayed complications were discussed with the patient.                        Return to normal activities tomorrow. Written discharge                        instructions were provided to the patient.                       - Discharge patient to home (with  escort).                       - The findings and recommendations were discussed with                        the patient.                       - The findings and recommendations were discussed with                        the patient's family. Procedure Code(s):    --- Professional ---                       [phone], Esophagogastroduodenoscopy, flexible, transoral;                        with biopsy, single or multiple Diagnosis Code(s):    --- Professional ---                       K44.9, Diaphragmatic hernia without obstruction or                        gangrene                       R13.10, Dysphagia, unspecified CPT copyright 2018 American Medical Association. All rights reserved. The codes documented in this report are preliminary and upon coder review may  be revised to meet current compliance requirements.  Vonda Antigua, MD Margretta Sidle B. Bonna Gains MD, MD 08/01/2018 12:14:04 PM This report has been signed electronically. Number of Addenda: 0 Note Initiated On: 08/01/2018 11:43 AM Estimated Blood Loss: Estimated blood loss: none.      [facility]

## 2018-08-01 NOTE — H&P (Signed)
Shannon Antigua, MD [address] [place], [place], [place], [address] [address], [place], [place], [place], [address] Phone: [phone]  Fax: [phone]  Primary Care Physician:  Tracie Harrier, MD   Pre-Procedure History & Physical: HPI:  Shannon Page is a 85 y.o. female is here for an EGD.   Past Medical History:  Diagnosis Date  . Anxiety     Past Surgical History:  Procedure Laterality Date  . APPENDECTOMY      Prior to Admission medications   Medication Sig Start Date End Date Taking? Authorizing Provider  aspirin EC 81 MG tablet Take by mouth.   Yes [provider]  Calcium-Vitamin D-Vitamin K (VIACTIV PO) Take by mouth.   Yes [provider]  OLANZapine (ZYPREXA) 7.5 MG tablet Take 7.5 mg by mouth at bedtime. 11/04/17  Yes [provider]  Omega-3 Fatty Acids (FISH OIL PO) Take by mouth.   Yes [provider]  hydrocortisone 2.5 % cream APPLY TO AFFECTED AREA ON THE SKIN TWICE A DAY TO FACE UNTIL CLEAR, THEN AS NEEDED FLARES 12/31/17   [provider]    Allergies as of 07/28/2018 - Review Complete 07/27/2018  Allergen Reaction Noted  . Moxifloxacin Other (See Comments) 05/17/2012  . Atenolol  12/24/2014  . Avelox [moxifloxacin hcl in nacl]  04/14/2017  . Clindamycin Rash 11/11/2011  . Clindamycin/lincomycin Rash 12/24/2014  . Nystatin Rash 10/07/2015  . Penicillins Rash 12/24/2014    Family History  Problem Relation Age of Onset  . Breast cancer Neg Hx     Social History   Socioeconomic History  . Marital status: Married    Spouse name: Not on file  . Number of children: Not on file  . Years of education: Not on file  . Highest education level: Not on file  Occupational History  . Not on file  Social Needs  . Financial resource strain: Not on file  . Food insecurity:    Worry: Not on file    Inability: Not on file  . Transportation needs:    Medical: Not on file    Non-medical: Not  on file  Tobacco Use  . Smoking status: Former Research scientist (life sciences)  . Smokeless tobacco: Never Used  Substance and Sexual Activity  . Alcohol use: Yes    Alcohol/week: 1.0 standard drinks    Types: 1 Glasses of wine per week  . Drug use: No  . Sexual activity: Never  Lifestyle  . Physical activity:    Days per week: Not on file    Minutes per session: Not on file  . Stress: Not on file  Relationships  . Social connections:    Talks on phone: Not on file    Gets together: Not on file    Attends religious service: Not on file    Active member of club or organization: Not on file    Attends meetings of clubs or organizations: Not on file    Relationship status: Not on file  . Intimate partner violence:    Fear of current or ex partner: Not on file    Emotionally abused: Not on file    Physically abused: Not on file    Forced sexual activity: Not on file  Other Topics Concern  . Not on file  Social History Narrative  . Not on file    Review of Systems: See HPI, otherwise negative ROS  Physical Exam: BP (!) 171/97   Pulse 100   Temp (!)  97.5 F (36.4 C)   Resp 16   Ht 4\' 11"  (1.499 m)   Wt 63 kg   SpO2 97%   BMI 28.07 kg/m  General:   Alert,  pleasant and cooperative in NAD Head:  Normocephalic and atraumatic. Neck:  Supple; no masses or thyromegaly. Lungs:  Clear throughout to auscultation, normal respiratory effort.    Heart:  +S1, +S2, Regular rate and rhythm, No edema. Abdomen:  Soft, nontender and nondistended. Normal bowel sounds, without guarding, and without rebound.   Neurologic:  Alert and  oriented x4;  grossly normal neurologically.  Impression/Plan: Shannon Page is here for an EGD for dysphagia  Risks, benefits, limitations, and alternatives regarding the procedure have been reviewed with the patient.  Questions have been answered.  All parties agreeable.   Virgel Manifold, MD  08/01/2018, 11:47 AM

## 2018-08-01 NOTE — Anesthesia Postprocedure Evaluation (Signed)
Anesthesia Post Note  Patient: Shannon Page  Procedure(s) Performed: ESOPHAGOGASTRODUODENOSCOPY (EGD) WITH PROPOFOL (N/A )  Patient location during evaluation: Endoscopy Anesthesia Type: General Level of consciousness: awake and alert and oriented Pain management: pain level controlled Vital Signs Assessment: post-procedure vital signs reviewed and stable Respiratory status: spontaneous breathing, nonlabored ventilation and respiratory function stable Cardiovascular status: blood pressure returned to baseline and stable Postop Assessment: no signs of nausea or vomiting Anesthetic complications: no     Last Vitals:  Vitals:   08/01/18 1230 08/01/18 1240  BP: (!) 139/91 (!) 152/90  Pulse:    Resp:    Temp:    SpO2:      Last Pain:  Vitals:   08/01/18 1240  TempSrc:   PainSc: 0-No pain                 [name]

## 2018-08-03 ENCOUNTER — Encounter: Payer: Self-pay | Admitting: Gastroenterology

## 2018-08-03 LAB — SURGICAL PATHOLOGY

## 2018-08-05 ENCOUNTER — Telehealth: Payer: Self-pay

## 2018-08-05 NOTE — Telephone Encounter (Signed)
-----   Message from Virgel Manifold, MD sent at 08/03/2018  2:31 PM EST ----- Dquan Cortopassi please let patient know, her biopsies did not show increase in the number of eosinophils, therefore no evidence of eosinophilic esophagitis.  She should chew her food well, eat in small bites.  As far as she told us the problem swallowing only occurred twice when she ate a large bite and has not happened again.  If this problems reoccurs, please ask her to call us and we can consider motility study such as manometry.

## 2018-08-05 NOTE — Telephone Encounter (Signed)
Pt notified of results and to contact the office if problem reoccurs and can schedule a manometry. Pt asked about the click that happens when she swallows that the doctor was going to think about. Will let Dr Bonna Gains know.

## 2018-08-11 NOTE — Telephone Encounter (Signed)
Pt notified and will contact ENT if this problem worsens.

## 2019-06-21 ENCOUNTER — Other Ambulatory Visit: Payer: Self-pay

## 2019-06-21 ENCOUNTER — Ambulatory Visit (INDEPENDENT_AMBULATORY_CARE_PROVIDER_SITE_OTHER): Payer: Medicare Other | Admitting: Podiatry

## 2019-06-21 DIAGNOSIS — M79676 Pain in unspecified toe(s): Secondary | ICD-10-CM

## 2019-06-21 DIAGNOSIS — Q828 Other specified congenital malformations of skin: Secondary | ICD-10-CM | POA: Diagnosis not present

## 2019-06-21 DIAGNOSIS — B351 Tinea unguium: Secondary | ICD-10-CM

## 2019-06-21 NOTE — Progress Notes (Signed)
She presents today chief complaint of painfully elongated toenails as well as painful callus left foot.  Objective: Vital signs are stable she is alert and oriented x3.  No erythema edema cellulitis drainage or odor severe digital pulleys are noted resulting in a porokeratotic lesion about a centimeter and a half in diameter subfirst metatarsal phalangeal joint of the left foot.  Toenails are long thick yellow dystrophic-like mycotic.  Assessment: Pain in limb secondary to porokeratotic lesion subfirst left.  Pain in limb secondary to onychomycosis.  Plan: Debridement of toenails 1 through 5 right 1345 left.  Debridement of reactive hyperkeratotic tissue.

## 2019-09-04 ENCOUNTER — Ambulatory Visit (INDEPENDENT_AMBULATORY_CARE_PROVIDER_SITE_OTHER): Payer: Medicare Other | Admitting: Podiatry

## 2019-09-04 ENCOUNTER — Encounter: Payer: Self-pay | Admitting: Podiatry

## 2019-09-04 ENCOUNTER — Other Ambulatory Visit: Payer: Self-pay

## 2019-09-04 DIAGNOSIS — Q828 Other specified congenital malformations of skin: Secondary | ICD-10-CM

## 2019-09-04 NOTE — Progress Notes (Signed)
She presents today chief complaint of painful callus subfirst metatarsophalangeal joint of her left foot with severe hallux valgus.  Objective: Vital signs are stable she is alert and oriented x3.  Pulses are palpable.  Reactive hyperkeratotic lesion subfirst metatarsophalangeal joint enucleated today and all reactive hyperkeratosis was removed no open lesions no iatrogenic lesions no erythema cellulitis drainage or odor.  Assessment: Poor keratoma subfirst metatarsophalangeal joint left foot.  Hallux valgus left foot.  Plan: Debridement of reactive hyperkeratotic tissue follow-up with her in 2 months

## 2019-10-04 ENCOUNTER — Other Ambulatory Visit: Payer: Self-pay | Admitting: Internal Medicine

## 2019-10-04 DIAGNOSIS — Z1231 Encounter for screening mammogram for malignant neoplasm of breast: Secondary | ICD-10-CM

## 2019-10-13 ENCOUNTER — Telehealth: Payer: Self-pay | Admitting: Podiatry

## 2019-10-13 NOTE — Telephone Encounter (Signed)
My guarantor account number is [id number]. I just have a quick question about my bill. I'll talk to you soon. Bye.

## 2019-11-10 ENCOUNTER — Ambulatory Visit
Admission: RE | Admit: 2019-11-10 | Discharge: 2019-11-10 | Disposition: A | Discharge status: Home / Self Care | Payer: Medicare Other | Source: Ambulatory Visit | Attending: Internal Medicine | Admitting: Internal Medicine

## 2019-11-10 DIAGNOSIS — Z1231 Encounter for screening mammogram for malignant neoplasm of breast: Secondary | ICD-10-CM | POA: Insufficient documentation

## 2019-11-13 ENCOUNTER — Encounter: Payer: Self-pay | Admitting: Podiatry

## 2019-11-13 ENCOUNTER — Other Ambulatory Visit: Payer: Self-pay

## 2019-11-13 ENCOUNTER — Ambulatory Visit (INDEPENDENT_AMBULATORY_CARE_PROVIDER_SITE_OTHER): Payer: Medicare Other | Admitting: Podiatry

## 2019-11-13 VITALS — Temp 98.2°F

## 2019-11-13 DIAGNOSIS — B351 Tinea unguium: Secondary | ICD-10-CM | POA: Diagnosis not present

## 2019-11-13 DIAGNOSIS — M79609 Pain in unspecified limb: Secondary | ICD-10-CM

## 2019-11-13 DIAGNOSIS — M79676 Pain in unspecified toe(s): Secondary | ICD-10-CM

## 2019-11-13 DIAGNOSIS — Q828 Other specified congenital malformations of skin: Secondary | ICD-10-CM

## 2019-11-13 NOTE — Progress Notes (Signed)
She presents today chief complaint of painful callus to the medial aspect of her bunion deformity left foot.  She is also complaining of painful toenails #2 #4 and #5 left foot.  Objective: Vital signs are stable alert and oriented x3 pulses are palpable about left foot.  Porokeratotic lesions plantar aspect of the first metatarsophalangeal joint secondary to the bunion deformity.  Hammertoe deformities resulting in ingrowing and incurvation of the toenails without infection.  Assessment: Ingrown toenails without infection with hammertoe deformities bunion deformity resulting in porokeratosis.  Plan: Debrided all reactive hyperkeratotic tissue debrided toenails #2 #4 #5 left foot.  Follow-up with her as 2 months

## 2019-12-06 ENCOUNTER — Ambulatory Visit (INDEPENDENT_AMBULATORY_CARE_PROVIDER_SITE_OTHER): Payer: Medicare Other | Admitting: Dermatology

## 2019-12-06 ENCOUNTER — Other Ambulatory Visit: Payer: Self-pay

## 2019-12-06 DIAGNOSIS — L821 Other seborrheic keratosis: Secondary | ICD-10-CM | POA: Diagnosis not present

## 2019-12-06 DIAGNOSIS — L820 Inflamed seborrheic keratosis: Secondary | ICD-10-CM | POA: Diagnosis not present

## 2019-12-06 NOTE — Patient Instructions (Addendum)
Cryotherapy Aftercare  . Wash gently with soap and water everyday.   . Apply Vaseline and Band-Aid daily until healed. Recommend daily broad spectrum sunscreen SPF 30+ to sun-exposed areas, reapply every 2 hours as needed. Call for new or changing lesions.  

## 2019-12-06 NOTE — Progress Notes (Signed)
   Follow-Up Visit   Subjective  Shannon Page is a 86 y.o. female who presents for the following: Skin Problem.  Patient here today for 2 spots on forehead and one on left neck. Present for a couple of months with some irritation.  No history of skin cancer.   The following portions of the chart were reviewed this encounter and updated as appropriate:     Review of Systems:  No other skin or systemic complaints except as noted in HPI or Assessment and Plan.  Objective  Well appearing patient in no apparent distress; mood and affect are within normal limits.  A focused examination was performed including face, neck, scalp. Relevant physical exam findings are noted in the Assessment and Plan.  Objective  L upper clavicle x 1, forehead x 2 (3): Keratotic waxy stuck-on papule   Objective  face: Stuck-on, waxy, tan-brown papules-- Discussed benign etiology and prognosis.    Assessment & Plan  Inflamed seborrheic keratosis (3) L upper clavicle x 1, forehead x 2  Destruction of lesion - L upper clavicle x 1, forehead x 2  Destruction method: cryotherapy   Informed consent: discussed and consent obtained   Lesion destroyed using liquid nitrogen: Yes   Region frozen until ice ball extended beyond lesion: Yes   Outcome: patient tolerated procedure well with no complications   Post-procedure details: wound care instructions given    Seborrheic keratosis face  Reassured benign age-related growth.  Recommend observation.  Discussed cryotherapy if spot(s) become irritated or inflamed.    Return if symptoms worsen or fail to improve.  Graciella Belton, RMA, am acting as scribe for Brendolyn Patty, MD .  Documentation: I have reviewed the above documentation for accuracy and completeness, and I agree with the above.  Brendolyn Patty MD

## 2019-12-08 ENCOUNTER — Ambulatory Visit: Payer: Medicare Other | Admitting: Dermatology

## 2019-12-27 ENCOUNTER — Ambulatory Visit: Payer: Medicare Other | Admitting: Podiatry

## 2020-01-17 ENCOUNTER — Ambulatory Visit (INDEPENDENT_AMBULATORY_CARE_PROVIDER_SITE_OTHER): Payer: Medicare Other | Admitting: Podiatry

## 2020-01-17 ENCOUNTER — Other Ambulatory Visit: Payer: Self-pay

## 2020-01-17 ENCOUNTER — Encounter: Payer: Self-pay | Admitting: Podiatry

## 2020-01-17 DIAGNOSIS — M79676 Pain in unspecified toe(s): Secondary | ICD-10-CM

## 2020-01-17 DIAGNOSIS — B351 Tinea unguium: Secondary | ICD-10-CM | POA: Diagnosis not present

## 2020-01-17 DIAGNOSIS — Q828 Other specified congenital malformations of skin: Secondary | ICD-10-CM

## 2020-01-17 NOTE — Progress Notes (Signed)
She presents today chief complaint of painfully elongated toenails and calluses bilaterally.  Objective: Pulses are palpable no open lesions or wounds.  Severe digital deformities bilateral 1 through 5 right foot 1345 on the left foot.  Toenails are long thick yellow dystrophic Lee mycotic multiple reactive hyperkeratotic lesions are present.  Assessment: Pain in limb secondary onychomycosis digital deformities and calluses.  Plan: Debridement of all reactive hyperkeratotic tissue debridement of toenails 1 through 5 bilateral.

## 2020-02-27 ENCOUNTER — Encounter: Payer: Self-pay | Admitting: Intensive Care

## 2020-02-27 ENCOUNTER — Other Ambulatory Visit: Payer: Self-pay

## 2020-02-27 ENCOUNTER — Emergency Department: Payer: Medicare Other

## 2020-02-27 ENCOUNTER — Inpatient Hospital Stay
Admission: EM | Admit: 2020-02-27 | Discharge: 2020-02-29 | DRG: 291 | Disposition: A | Discharge status: Home Health | Payer: Medicare Other | Attending: Internal Medicine | Admitting: Internal Medicine

## 2020-02-27 DIAGNOSIS — Z880 Allergy status to penicillin: Secondary | ICD-10-CM

## 2020-02-27 DIAGNOSIS — Z7982 Long term (current) use of aspirin: Secondary | ICD-10-CM

## 2020-02-27 DIAGNOSIS — Z79899 Other long term (current) drug therapy: Secondary | ICD-10-CM

## 2020-02-27 DIAGNOSIS — Z87891 Personal history of nicotine dependence: Secondary | ICD-10-CM

## 2020-02-27 DIAGNOSIS — I2721 Secondary pulmonary arterial hypertension: Secondary | ICD-10-CM | POA: Diagnosis present

## 2020-02-27 DIAGNOSIS — F419 Anxiety disorder, unspecified: Secondary | ICD-10-CM | POA: Diagnosis present

## 2020-02-27 DIAGNOSIS — I872 Venous insufficiency (chronic) (peripheral): Secondary | ICD-10-CM | POA: Diagnosis present

## 2020-02-27 DIAGNOSIS — J9611 Chronic respiratory failure with hypoxia: Secondary | ICD-10-CM

## 2020-02-27 DIAGNOSIS — Z86711 Personal history of pulmonary embolism: Secondary | ICD-10-CM

## 2020-02-27 DIAGNOSIS — M8580 Other specified disorders of bone density and structure, unspecified site: Secondary | ICD-10-CM | POA: Diagnosis present

## 2020-02-27 DIAGNOSIS — I5031 Acute diastolic (congestive) heart failure: Principal | ICD-10-CM | POA: Diagnosis present

## 2020-02-27 DIAGNOSIS — R0609 Other forms of dyspnea: Secondary | ICD-10-CM

## 2020-02-27 DIAGNOSIS — Z91040 Latex allergy status: Secondary | ICD-10-CM

## 2020-02-27 DIAGNOSIS — Z881 Allergy status to other antibiotic agents status: Secondary | ICD-10-CM

## 2020-02-27 DIAGNOSIS — J9601 Acute respiratory failure with hypoxia: Secondary | ICD-10-CM | POA: Diagnosis present

## 2020-02-27 DIAGNOSIS — Z20822 Contact with and (suspected) exposure to covid-19: Secondary | ICD-10-CM | POA: Diagnosis present

## 2020-02-27 DIAGNOSIS — Z888 Allergy status to other drugs, medicaments and biological substances status: Secondary | ICD-10-CM

## 2020-02-27 LAB — BASIC METABOLIC PANEL
Anion gap: 14 (ref 5–15)
BUN: 26 mg/dL — ABNORMAL HIGH (ref 8–23)
CO2: 25 mmol/L (ref 22–32)
Calcium: 9.9 mg/dL (ref 8.9–10.3)
Chloride: 100 mmol/L (ref 98–111)
Creatinine, Ser: 1.1 mg/dL — ABNORMAL HIGH (ref 0.44–1.00)
GFR calc Af Amer: 54 mL/min — ABNORMAL LOW (ref >60)
GFR calc non Af Amer: 46 mL/min — ABNORMAL LOW (ref >60)
Glucose, Bld: 93 mg/dL (ref 70–99)
Potassium: 3.8 mmol/L (ref 3.5–5.1)
Sodium: 139 mmol/L (ref 135–145)

## 2020-02-27 LAB — TROPONIN I (HIGH SENSITIVITY)
Troponin I (High Sensitivity): 16 ng/L (ref <18)
Troponin I (High Sensitivity): 17 ng/L (ref <18)

## 2020-02-27 LAB — CBC
HCT: 52.4 % — ABNORMAL HIGH (ref 36.0–46.0)
Hemoglobin: 16.7 g/dL — ABNORMAL HIGH (ref 12.0–15.0)
MCH: 30.5 pg (ref 26.0–34.0)
MCHC: 31.9 g/dL (ref 30.0–36.0)
MCV: 95.6 fL (ref 80.0–100.0)
Platelets: 216 10*3/uL (ref 150–400)
RBC: 5.48 MIL/uL — ABNORMAL HIGH (ref 3.87–5.11)
RDW: 13.6 % (ref 11.5–15.5)
WBC: 8.8 10*3/uL (ref 4.0–10.5)
nRBC: 0 % (ref 0.0–0.2)

## 2020-02-27 NOTE — ED Notes (Signed)
Pt reports oxygen tank on empty at this time, changed out with new tank, sats on room still 88%, placed on 3L up to 95%.

## 2020-02-27 NOTE — ED Triage Notes (Signed)
Patient c/o being sob X 4 months that has worsened the last 2 weeks. Tech found patient to be 85% on RA in triage. Denies pain or any other symptoms

## 2020-02-27 NOTE — ED Notes (Signed)
Pt O2 85% on room air. Colletta Maryland, RN made aware. Pt placed on 2L O2 via [place]. Pt O2 sat came up to 93% with 2L O2.

## 2020-02-28 ENCOUNTER — Observation Stay
Admit: 2020-02-28 | Discharge: 2020-02-28 | Disposition: A | Discharge status: Home / Self Care | Payer: Medicare Other | Attending: Internal Medicine | Admitting: Internal Medicine

## 2020-02-28 ENCOUNTER — Emergency Department: Payer: Medicare Other

## 2020-02-28 DIAGNOSIS — I872 Venous insufficiency (chronic) (peripheral): Secondary | ICD-10-CM | POA: Diagnosis present

## 2020-02-28 DIAGNOSIS — R0609 Other forms of dyspnea: Secondary | ICD-10-CM

## 2020-02-28 DIAGNOSIS — I5031 Acute diastolic (congestive) heart failure: Secondary | ICD-10-CM | POA: Diagnosis present

## 2020-02-28 DIAGNOSIS — M8580 Other specified disorders of bone density and structure, unspecified site: Secondary | ICD-10-CM | POA: Diagnosis present

## 2020-02-28 DIAGNOSIS — Z20822 Contact with and (suspected) exposure to covid-19: Secondary | ICD-10-CM | POA: Diagnosis present

## 2020-02-28 DIAGNOSIS — Z880 Allergy status to penicillin: Secondary | ICD-10-CM | POA: Diagnosis not present

## 2020-02-28 DIAGNOSIS — J9601 Acute respiratory failure with hypoxia: Secondary | ICD-10-CM | POA: Diagnosis present

## 2020-02-28 DIAGNOSIS — Z79899 Other long term (current) drug therapy: Secondary | ICD-10-CM | POA: Diagnosis not present

## 2020-02-28 DIAGNOSIS — Z87891 Personal history of nicotine dependence: Secondary | ICD-10-CM | POA: Diagnosis not present

## 2020-02-28 DIAGNOSIS — Z86711 Personal history of pulmonary embolism: Secondary | ICD-10-CM | POA: Diagnosis not present

## 2020-02-28 DIAGNOSIS — I2721 Secondary pulmonary arterial hypertension: Secondary | ICD-10-CM | POA: Diagnosis present

## 2020-02-28 DIAGNOSIS — J9611 Chronic respiratory failure with hypoxia: Secondary | ICD-10-CM

## 2020-02-28 DIAGNOSIS — F419 Anxiety disorder, unspecified: Secondary | ICD-10-CM | POA: Diagnosis present

## 2020-02-28 DIAGNOSIS — Z888 Allergy status to other drugs, medicaments and biological substances status: Secondary | ICD-10-CM | POA: Diagnosis not present

## 2020-02-28 DIAGNOSIS — Z7982 Long term (current) use of aspirin: Secondary | ICD-10-CM | POA: Diagnosis not present

## 2020-02-28 DIAGNOSIS — Z881 Allergy status to other antibiotic agents status: Secondary | ICD-10-CM | POA: Diagnosis not present

## 2020-02-28 DIAGNOSIS — Z91040 Latex allergy status: Secondary | ICD-10-CM | POA: Diagnosis not present

## 2020-02-28 LAB — HEPATIC FUNCTION PANEL
ALT: 36 U/L (ref 0–44)
AST: 38 U/L (ref 15–41)
Albumin: 4.4 g/dL (ref 3.5–5.0)
Alkaline Phosphatase: 65 U/L (ref 38–126)
Bilirubin, Direct: <0.1 mg/dL (ref 0.0–0.2)
Total Bilirubin: 0.9 mg/dL (ref 0.3–1.2)
Total Protein: 8 g/dL (ref 6.5–8.1)

## 2020-02-28 LAB — CBC
HCT: 47.5 % — ABNORMAL HIGH (ref 36.0–46.0)
Hemoglobin: 15.6 g/dL — ABNORMAL HIGH (ref 12.0–15.0)
MCH: 31.1 pg (ref 26.0–34.0)
MCHC: 32.8 g/dL (ref 30.0–36.0)
MCV: 94.8 fL (ref 80.0–100.0)
Platelets: 186 10*3/uL (ref 150–400)
RBC: 5.01 MIL/uL (ref 3.87–5.11)
RDW: 13.7 % (ref 11.5–15.5)
WBC: 6.7 10*3/uL (ref 4.0–10.5)
nRBC: 0 % (ref 0.0–0.2)

## 2020-02-28 LAB — BASIC METABOLIC PANEL
Anion gap: 9 (ref 5–15)
BUN: 25 mg/dL — ABNORMAL HIGH (ref 8–23)
CO2: 28 mmol/L (ref 22–32)
Calcium: 8.8 mg/dL — ABNORMAL LOW (ref 8.9–10.3)
Chloride: 102 mmol/L (ref 98–111)
Creatinine, Ser: 1.08 mg/dL — ABNORMAL HIGH (ref 0.44–1.00)
GFR calc Af Amer: 55 mL/min — ABNORMAL LOW (ref >60)
GFR calc non Af Amer: 47 mL/min — ABNORMAL LOW (ref >60)
Glucose, Bld: 91 mg/dL (ref 70–99)
Potassium: 4 mmol/L (ref 3.5–5.1)
Sodium: 139 mmol/L (ref 135–145)

## 2020-02-28 LAB — ECHOCARDIOGRAM COMPLETE
AR max vel: 2.11 cm2
AV Area VTI: 2.42 cm2
AV Area mean vel: 2.06 cm2
AV Mean grad: 2 mmHg
AV Peak grad: 4.8 mmHg
Ao pk vel: 1.1 m/s
Area-P 1/2: 2.91 cm2
Height: 59 in
S' Lateral: 2.22 cm
Weight: 2229.29 oz

## 2020-02-28 LAB — SARS CORONAVIRUS 2 BY RT PCR (HOSPITAL ORDER, PERFORMED IN ~~LOC~~ HOSPITAL LAB): SARS Coronavirus 2: NEGATIVE

## 2020-02-28 LAB — BRAIN NATRIURETIC PEPTIDE: B Natriuretic Peptide: 370.8 pg/mL — ABNORMAL HIGH (ref 0.0–100.0)

## 2020-02-28 MED ORDER — SODIUM CHLORIDE 0.9% FLUSH
3.0000 mL | Freq: Two times a day (BID) | INTRAVENOUS | Status: DC
  Administered 2020-02-28 – 2020-02-29 (×3): 3 mL via INTRAVENOUS

## 2020-02-28 MED ORDER — ENOXAPARIN SODIUM 40 MG/0.4ML ~~LOC~~ SOLN
40.0000 mg | SUBCUTANEOUS | Status: DC
  Administered 2020-02-28 – 2020-02-29 (×2): 40 mg via SUBCUTANEOUS
  Filled 2020-02-28 (×2): qty 0.4

## 2020-02-28 MED ORDER — ACETAMINOPHEN 325 MG PO TABS
650.0000 mg | ORAL_TABLET | ORAL | Status: DC | PRN
  Administered 2020-02-28: 650 mg via ORAL
  Filled 2020-02-28: qty 2

## 2020-02-28 MED ORDER — MAGNESIUM SULFATE 2 GM/50ML IV SOLN
2.0000 g | Freq: Once | INTRAVENOUS | Status: AC
  Administered 2020-02-28: 2 g via INTRAVENOUS
  Filled 2020-02-28: qty 50

## 2020-02-28 MED ORDER — IOHEXOL 350 MG/ML SOLN
75.0000 mL | Freq: Once | INTRAVENOUS | Status: AC | PRN
  Administered 2020-02-28: 75 mL via INTRAVENOUS

## 2020-02-28 MED ORDER — SODIUM CHLORIDE 0.9% FLUSH: 3.0000 mL | INTRAVENOUS | Status: DC | PRN

## 2020-02-28 MED ORDER — FUROSEMIDE 10 MG/ML IJ SOLN
20.0000 mg | Freq: Two times a day (BID) | INTRAMUSCULAR | Status: DC
  Administered 2020-02-28 (×3): 20 mg via INTRAVENOUS
  Filled 2020-02-28 (×3): qty 4

## 2020-02-28 MED ORDER — SODIUM CHLORIDE 0.9 % IV SOLN
250.0000 mL | INTRAVENOUS | Status: DC | PRN
  Administered 2020-02-28: 250 mL via INTRAVENOUS

## 2020-02-28 MED ORDER — ONDANSETRON HCL 4 MG/2ML IJ SOLN: 4.0000 mg | Freq: Four times a day (QID) | INTRAMUSCULAR | Status: DC | PRN

## 2020-02-28 NOTE — Evaluation (Signed)
Occupational Therapy Evaluation Patient Details Name: Shannon Page MRN: [id number] DOB: 24-Sep-1933 Today's Date: 02/28/2020    History of Present Illness 86 y/o F with PMH: anxiety who presented to ED w/ SOB on exertion that has been increasing over the past 2 weeks.  She reports a several month history of shortness of breath but with fair exercise tolerance up to about 30 minutes a day but now she cannot walk for more than 10 minutes without getting short of breath. W/u in ED including checking O2 sats which were 85% on room air. Pt currenlty satting 97% on 2Lnc.   Clinical Impression   Pt was seen for OT evaluation this date. Prior to hospital admission, pt was MOD I with fxl mobility with SPC and Indep with self care ADLs and HH IADLs. Pt lives in [place] in [place]. Currently pt demonstrates impairments as described below (See OT problem list) which functionally limit her ability to perform ADL/self-care tasks. Pt currently requires CGA with progress to SUPV with ADL transfers and mobility with and without SPC, setup for seated UB/LB ADLs and SUPV for standing self care.  Pt would benefit from skilled OT to address noted impairments and functional limitations (see below for any additional details) in order to maximize safety and independence while minimizing falls risk and caregiver burden. Upon hospital discharge, recommend OPOT to maximize pt safety and return to functional independence during meaningful occupations of daily life. Of note: Pt's O2 at 97% on 2Lnc at rest, 93% on RA at rest. With fxl activity and fxl mobility, 89-90% on RA. With ~15 seconds of PLB education/trial with patient, spO2 increased to 93-94% on RA. RN notified and requests that  stay on at this time. OT educates re: use of Incentive spirometer.     Follow Up Recommendations  Outpatient OT    Equipment Recommendations  Tub/shower bench    Recommendations for Other Services       Precautions /  Restrictions Precautions Precautions: Fall Precaution Comments: mild fall risk Restrictions Weight Bearing Restrictions: No      Mobility Bed Mobility Overal bed mobility: Modified Independent                Transfers Overall transfer level: Needs assistance Equipment used: Straight cane Transfers: Sit to/from Stand Sit to Stand: Supervision;Min guard         General transfer comment: CGA with progress to SUPV    Balance Overall balance assessment: Mild deficits observed, not formally tested                                         ADL either performed or assessed with clinical judgement   ADL Overall ADL's : Modified independent                                       General ADL Comments: extended time to perform LB dressing seated EOB, CGA for ADL transfers and mobility initiallly, but ultimately completes at [facility] level with and w/o her SPC. SBA standing sink-side to complete oral care.     Vision Baseline Vision/History: Wears glasses Wears Glasses: At all times Patient Visual Report: No change from baseline       Perception     Praxis      Pertinent Vitals/Pain  Pain Assessment: 0-10 Pain Score: 2  Pain Location: Pt reports: little bit of cramping in LEs since starting the Lasix Pain Descriptors / Indicators: Discomfort Pain Intervention(s): Limited activity within patient's tolerance;Monitored during session     Hand Dominance     Extremity/Trunk Assessment Upper Extremity Assessment Upper Extremity Assessment: Overall WFL for tasks assessed   Lower Extremity Assessment Lower Extremity Assessment: Overall WFL for tasks assessed;Generalized weakness (ROM WFL, some weakness notable with fxl mobility on OT assessment)   Cervical / Trunk Assessment Cervical / Trunk Assessment: Kyphotic   Communication Communication Communication: No difficulties   Cognition Arousal/Alertness: Awake/alert Behavior During  Therapy: WFL for tasks assessed/performed Overall Cognitive Status: Within Functional Limits for tasks assessed                                     General Comments  Pt's O2 at 97% on 2Lnc at rest, 93% on RA at rest. With fxl activity and fxl mobility, 89-90% on RA. With ~15 seconds of PLB education/trial with patient, spO2 increased to 93-94% on RA. RN notified and requests that  stay on at this time. OT educates re: use of Inceptinve spirometer.    Exercises Other Exercises Other Exercises: OT facilitates ed re: O2 monitoring with fxl activity at home, safety with fxl mobility/ADL transfers, and into to [facility] strategies. Pt with good understanding, would benefit from f/u re: EC strategies.   Shoulder Instructions      Home Living Family/patient expects to be discharged to:: Private residence Living Arrangements: Spouse/significant other Available Help at Discharge: Family;Available 24 hours/day Type of Home: House ([place] Home Access: Stairs to enter Technical brewer of Steps: 2 Entrance Stairs-Rails: Can reach both Home Layout: One level     Bathroom Shower/Tub: Walk-in shower         Home Equipment: Cane - single point;Grab bars - tub/shower          Prior Functioning/Environment Level of Independence: Independent with assistive device(s)        Comments: endorses furniture cruising within the home and primarily using [place] out in community.        OT Problem List: Decreased strength;Decreased activity tolerance;Impaired balance (sitting and/or standing);Cardiopulmonary status limiting activity      OT Treatment/Interventions: Self-care/ADL training;Therapeutic exercise;Energy conservation;Therapeutic activities;Patient/family education;Balance training    OT Goals(Current goals can be found in the care plan section) Acute Rehab OT Goals Patient Stated Goal: to go back to [place] and get started back in balance  class OT Goal Formulation: With patient Time For Goal Achievement: 03/13/20 Potential to Achieve Goals: Good  OT Frequency: Min 1X/week   Barriers to D/C:            Co-evaluation              AM-PAC OT "6 Clicks" Daily Activity     Outcome Measure Help from another person eating meals?: None Help from another person taking care of personal grooming?: A Little Help from another person toileting, which includes using toliet, bedpan, or urinal?: A Little Help from another person bathing (including washing, rinsing, drying)?: A Little Help from another person to put on and taking off regular upper body clothing?: None Help from another person to put on and taking off regular lower body clothing?: None 6 Click Score: 21   End of Session Equipment Utilized During Treatment: Gait belt;Other (comment);Oxygen (  [facility]) Nurse Communication: Mobility status;Other (comment) (notified RN re: O2 sats)  Activity Tolerance: Patient tolerated treatment well Patient left: in chair;with call bell/phone within reach;with chair alarm set  OT Visit Diagnosis: Unsteadiness on feet (R26.81);Muscle weakness (generalized) (M62.81)                Time: 0086-7619 OT Time Calculation (min): 41 min Charges:  OT General Charges $OT Visit: 1 Visit OT Evaluation $OT Eval Low Complexity: 1 Low OT Treatments $Self Care/Home Management : 8-22 mins $Therapeutic Activity: 8-22 mins  Gerrianne Scale, MS, OTR/L ascom [phone] 02/28/20, 12:24 PM

## 2020-02-28 NOTE — ED Provider Notes (Signed)
[facility] Emergency Department Provider Note  ____________________________________________  Time seen: Approximately 12:09 AM  I have reviewed the triage vital signs and the nursing notes.   HISTORY  Chief Complaint Shortness of Breath   HPI Shannon Page is a 86 y.o. female with a history of venous insufficiency, osteopenia, hiatal hernia, anxiety who presents for evaluation of shortness of breath.  Patient has noticed progressively worsening shortness of breath over the last 4 months which has become much more severe noticeable over the last 2 weeks.  She reports that she is usually able to walk about 30 minutes a day but now after 10 minutes she has to stop to catch her breath.  The shortness of breath is present constantly but worse with exertion. She also reports orthopnea, increased b/l LE swelling, and increased abdominal girth. She denies history of CHF. She is a former smoker and stopped over 30 years ago. No history of COPD. She has been fully vaccinated against COVID and denies fever, body aches, N/V, sore throat. She reports a history of PE in the past in the setting of HRT. She is no longer on hormones.  She denies CP.  Past Medical History:  Diagnosis Date  . Anxiety     Patient Active Problem List   Diagnosis Date Noted  . Acute respiratory failure with hypoxia ([place]) 02/28/2020  . Dyspnea on exertion 02/28/2020  . PAH (pulmonary artery hypertension) ([place]) - on CTA chest 02/28/2020  . Other dysphagia   . Hiatal hernia   . Venous insufficiency, peripheral 05/17/2012  . Osteopenia 01/04/2012    Past Surgical History:  Procedure Laterality Date  . APPENDECTOMY    . ESOPHAGOGASTRODUODENOSCOPY (EGD) WITH PROPOFOL N/A 08/01/2018   Procedure: ESOPHAGOGASTRODUODENOSCOPY (EGD) WITH PROPOFOL;  Surgeon: Virgel Manifold, MD;  Location: [facility] ENDOSCOPY;  Service: Endoscopy;  Laterality: N/A;    Prior to Admission medications   Medication Sig  Start Date End Date Taking? Authorizing Provider  aspirin EC 81 MG tablet Take by mouth.   Yes [provider]  Calcium-Vitamin D-Vitamin K (VIACTIV PO) Take by mouth.   Yes [provider]  hydrocortisone 2.5 % cream APPLY TO AFFECTED AREA ON THE SKIN TWICE A DAY TO FACE UNTIL CLEAR, THEN AS NEEDED FLARES 12/31/17  Yes [provider]  OLANZapine (ZYPREXA) 7.5 MG tablet Take 7.5 mg by mouth at bedtime. 11/04/17  Yes [provider]  Omega-3 Fatty Acids (FISH OIL PO) Take 500 mg by mouth 2 (two) times daily.    Yes [provider]    Allergies Moxifloxacin, Atenolol, Avelox [moxifloxacin hcl in nacl], Clindamycin, Clindamycin/lincomycin, Latex, Nystatin, and Penicillins  Family History  Problem Relation Age of Onset  . Breast cancer Neg Hx     Social History Social History   Tobacco Use  . Smoking status: Former Research scientist (life sciences)  . Smokeless tobacco: Never Used  Vaping Use  . Vaping Use: Never used  Substance Use Topics  . Alcohol use: Yes    Alcohol/week: 5.0 standard drinks    Types: 5 Cans of beer per week  . Drug use: No    Review of Systems  Constitutional: Negative for fever. Eyes: Negative for visual changes. ENT: Negative for sore throat. Neck: No neck pain  Cardiovascular: Negative for chest pain. + orthopnea Respiratory: + shortness of breath. Gastrointestinal: Negative for abdominal pain, vomiting or diarrhea. Genitourinary: Negative for dysuria. Musculoskeletal: Negative for back pain. + b/l leg swelling Skin: Negative for rash. Neurological:  Negative for headaches, weakness or numbness. Psych: No SI or HI  ____________________________________________   PHYSICAL EXAM:  VITAL SIGNS: ED Triage Vitals  Enc Vitals Group     BP 02/27/20 1407 (!) 174/96     Pulse Rate 02/27/20 1918 83     Resp 02/27/20 1407 (!) 28     Temp 02/27/20 1407 98.2 F (36.8 C)     Temp Source 02/27/20 1407 Oral     SpO2 02/27/20 1407 (!) 85 %      Weight 02/27/20 1407 140 lb (63.5 kg)     Height 02/27/20 1407 4\' 11"  (1.499 m)     Head Circumference --      Peak Flow --      Pain Score 02/27/20 1415 0     Pain Loc --      Pain Edu? --      Excl. in [place]? --     Constitutional: Alert and oriented. Well appearing and in no apparent distress. HEENT:      Head: Normocephalic and atraumatic.         Eyes: Conjunctivae are normal. Sclera is non-icteric.       Mouth/Throat: Mucous membranes are moist.       Neck: Supple with no signs of meningismus. Cardiovascular: Regular rate and rhythm. No murmurs, gallops, or rubs. 2+ symmetrical distal pulses are present in all extremities. No JVD. Respiratory: Hypoxic on RA to mid 80s, crackles on the left, clear on the right, no wheezing, good air movement, normal WOB Gastrointestinal: Soft, non tender, and non distended Musculoskeletal: 1+ pitting edema bilaterally. Neurologic: Normal speech and language. Face is symmetric. Moving all extremities. No gross focal neurologic deficits are appreciated. Skin: Skin is warm, dry and intact. No rash noted. Psychiatric: Mood and affect are normal. Speech and behavior are normal.  ____________________________________________   LABS (all labs ordered are listed, but only abnormal results are displayed)  Labs Reviewed  BASIC METABOLIC PANEL - Abnormal; Notable for the following components:      Result Value   BUN 26 (*)    Creatinine, Ser 1.10 (*)    GFR calc non Af Amer 46 (*)    GFR calc Af Amer 54 (*)    All other components within normal limits  CBC - Abnormal; Notable for the following components:   RBC 5.48 (*)    Hemoglobin 16.7 (*)    HCT 52.4 (*)    All other components within normal limits  BRAIN NATRIURETIC PEPTIDE - Abnormal; Notable for the following components:   B Natriuretic Peptide 370.8 (*)    All other components within normal limits  SARS CORONAVIRUS 2 BY RT PCR (HOSPITAL ORDER, [facility])    HEPATIC FUNCTION PANEL  CBC  CREATININE, SERUM  TROPONIN I (HIGH SENSITIVITY)  TROPONIN I (HIGH SENSITIVITY)   ____________________________________________  EKG  ED ECG REPORT I, Rudene Re, the attending physician, personally viewed and interpreted this ECG.  Normal sinus rhythm, rate of 87, normal intervals, right axis deviation, no ST elevations or depressions.  No prior for comparison. ____________________________________________  RADIOLOGY  I have personally reviewed the images performed during this visit and I agree with the Radiologist's read.   Interpretation by Radiologist:  DG Chest 2 View  Result Date: 02/27/2020 CLINICAL DATA:  Shortness of breath for the past 4 months. EXAM: CHEST - 2 VIEW COMPARISON:  None. FINDINGS: The patient is slightly rotated. The heart size and mediastinal contours are within  normal limits. Normal pulmonary vascularity. No focal consolidation, pleural effusion, or pneumothorax. No acute osseous abnormality. IMPRESSION: No active cardiopulmonary disease. Electronically Signed   By: Titus Dubin M.D.   On: 02/27/2020 14:51   CT Angio Chest PE W and/or Wo Contrast  Result Date: 02/28/2020 CLINICAL DATA:  86 year old female with progressed shortness of breath over the past 2 weeks. EXAM: CT ANGIOGRAPHY CHEST WITH CONTRAST TECHNIQUE: Multidetector CT imaging of the chest was performed using the standard protocol during bolus administration of intravenous contrast. Multiplanar CT image reconstructions and MIPs were obtained to evaluate the vascular anatomy. CONTRAST:  50mL OMNIPAQUE IOHEXOL 350 MG/ML SOLN COMPARISON:  Chest radiographs tonight. FINDINGS: Cardiovascular: Excellent contrast bolus timing in the pulmonary arterial tree. Central pulmonary artery enlargement (series 5, image 116) suggesting a degree of pulmonary artery hypertension. No focal filling defect identified in the pulmonary arteries to suggest acute pulmonary embolism. Mild  cardiomegaly with appearance of right atrial enlargement (series 5, image 184). No pericardial effusion. Calcified coronary artery atherosclerosis. Little contrast in the aorta. Minimal thoracic aortic atherosclerosis. Mediastinum/Nodes: Negative. Lungs/Pleura: Major airways are patent. There is bilateral mosaic attenuation (series 6, image 44). Mild superimposed subpleural scarring is scattered bilaterally. Enhancing atelectasis in the left costophrenic angle. Enhancing atelectasis in the lingula. No area suspicious for pneumonia. Tiny calcified granuloma in the superior segment of the right lower lobe. Upper Abdomen: Negative visible liver, spleen, pancreas, adrenal glands, kidneys and bowel in the upper abdomen. Musculoskeletal: Scoliosis and exaggerated thoracic kyphosis. No acute osseous abnormality identified. Review of the MIP images confirms the above findings. IMPRESSION: 1. Negative for acute pulmonary embolus. 2. Central pulmonary artery enlargement suggesting a degree of pulmonary artery hypertension, and bilateral pulmonary mosaic attenuation which can be seen in the setting of chronic small airway or small vessel disease. Lung base atelectasis but no area suspicious for pneumonia. 3. Calcified coronary artery atherosclerosis. Mild cardiomegaly/right atrial enlargement. Electronically Signed   By: Genevie Ann M.D.   On: 02/28/2020 00:59     ____________________________________________   PROCEDURES  Procedure(s) performed:yes .1-3 Lead EKG Interpretation Performed by: Rudene Re, MD Authorized by: Rudene Re, MD     Interpretation: normal     ECG rate assessment: normal     Rhythm: sinus rhythm     Ectopy: none     Critical Care performed: yes  CRITICAL CARE Performed by: Rudene Re  ?  Total critical care time: 40 min  Critical care time was exclusive of separately billable procedures and treating other patients.  Critical care was necessary to treat or  prevent imminent or life-threatening deterioration.  Critical care was time spent personally by me on the following activities: development of treatment plan with patient and/or surrogate as well as nursing, discussions with consultants, evaluation of patient's response to treatment, examination of patient, obtaining history from patient or surrogate, ordering and performing treatments and interventions, ordering and review of laboratory studies, ordering and review of radiographic studies, pulse oximetry and re-evaluation of patient's condition.  ____________________________________________   INITIAL IMPRESSION / ASSESSMENT AND PLAN / ED COURSE  86 y.o. female with a history of venous insufficiency, osteopenia, hiatal hernia, anxiety who presents for evaluation of shortness of breath, orthopnea, increased abdominal girth, and bilateral lower extremity edema which has been progressively worse for the last 4 months but markedly worse over the last 2 weeks.  Patient found to be hypoxic in the waiting room on room air and put on oxygen.  She has normal work of  breathing.  She does look volume overloaded with 1+ pitting edema and crackles on the left.  She does have a history of PE while on hormonal therapy in the past.  Differential diagnosis including new onset CHF versus pulmonary embolism versus pneumonia.  She has no fever or cough.  She has been fully vaccinated for Covid.  Chest x-ray with no evidence of edema or cardiomegaly, confirmed by radiology.  CT angio of the chest is pending.  EKG showing no acute ischemic changes.  Troponin x2 is negative.  Creatinine is at baseline.  No leukocytosis.  Patient placed on telemetry for close monitoring.  Old medical records reviewed.  _________________________ 1:43 AM on 02/28/2020 -----------------------------------------  BNP slightly elevated however imaging studies showing no significant cardiomegaly or edema.  CT is negative for PE, pneumonia,  edema.  Possibly showing some signs of undiagnosed lung disease which may be causing some component of heart strain.  Since patient is hypoxic she will be admitted to the hospitalist service for further evaluation and an echocardiogram.  I discussed with Dr. Damita Dunnings who will admit her.    _____________________________________________ Please note:  Patient was evaluated in Emergency Department today for the symptoms described in the history of present illness. Patient was evaluated in the context of the global COVID-19 pandemic, which necessitated consideration that the patient might be at risk for infection with the SARS-CoV-2 virus that causes COVID-19. Institutional protocols and algorithms that pertain to the evaluation of patients at risk for COVID-19 are in a state of rapid change based on information released by regulatory bodies including the CDC and federal and state organizations. These policies and algorithms were followed during the patient's care in the ED.  Some ED evaluations and interventions may be delayed as a result of limited staffing during the pandemic.    Controlled Substance Database was reviewed by me. ____________________________________________   FINAL CLINICAL IMPRESSION(S) / ED DIAGNOSES   Final diagnoses:  Acute respiratory failure with hypoxia ([place])      NEW MEDICATIONS STARTED DURING THIS VISIT:  ED Discharge Orders    None       Note:  This document was prepared using Dragon voice recognition software and may include unintentional dictation errors.    Alfred Levins, [place], MD 02/28/20 [phone]

## 2020-02-28 NOTE — Progress Notes (Signed)
*  PRELIMINARY RESULTS* Echocardiogram 2D Echocardiogram has been performed.  Shannon Page 02/28/2020, 9:52 AM

## 2020-02-28 NOTE — H&P (Signed)
History and Physical    Shannon Page INO:[id number] DOB: Nov 08, 1933 DOA: 02/27/2020  PCP: Tracie Harrier, MD   Patient coming from: Home  I have personally briefly reviewed patient's old medical records in [place]  Chief Complaint: Shortness of breath on exertion  HPI: Shannon Page is a 86 y.o. female with medical history significant for anxiety who presents to the emergency room with shortness of breath on exertion that has been increasing over the past 2 weeks.  She reports a several month history of shortness of breath but with fair exercise tolerance up to about 30 minutes a day but now she cannot walk for more than 10 minutes without getting short of breath.  She endorses lower extremity edema bilaterally without leg pain, having to sit up at night to sleep as well as increased abdominal gait.  She denies cough fever or chills and has been fully vaccinated against Covid.  Denies chest pain, nausea vomiting, abdominal pain.  Has a remote history of PE while on HRT but is no longer on hormones. ED Course: On arrival, she was tachypneic with O2 sat 85% on room air, afebrile, BP 174/96.  EKG normal sinus rhythm with no acute ST-T wave changes.  Chest x-ray  Covid PCR pending. no acute disease.  Troponin negative at 16/17, BNP elevated at 370 and creatinine slightly elevated at 1.13, baseline unknown.  Patient had a CTA chest that showed no PE but did show pulmonary hypertension with right atrial enlargement, no infiltrate.  Hospitalist consulted for admission.  Review of Systems: As per HPI otherwise all other systems on review of systems negative.    Past Medical History:  Diagnosis Date  . Anxiety     Past Surgical History:  Procedure Laterality Date  . APPENDECTOMY    . ESOPHAGOGASTRODUODENOSCOPY (EGD) WITH PROPOFOL N/A 08/01/2018   Procedure: ESOPHAGOGASTRODUODENOSCOPY (EGD) WITH PROPOFOL;  Surgeon: Virgel Manifold, MD;  Location: [facility] ENDOSCOPY;  Service:  Endoscopy;  Laterality: N/A;     reports that she has quit smoking. She has never used smokeless tobacco. She reports current alcohol use of about 5.0 standard drinks of alcohol per week. She reports that she does not use drugs.  Allergies  Allergen Reactions  . Moxifloxacin Other (See Comments)    tendonitis  . Atenolol     Dizziness   . Avelox [Moxifloxacin Hcl In Nacl]   . Clindamycin Rash  . Clindamycin/Lincomycin Rash  . Latex Rash  . Nystatin Rash  . Penicillins Rash    Family History  Problem Relation Age of Onset  . Breast cancer Neg Hx       Prior to Admission medications   Medication Sig Start Date End Date Taking? Authorizing Provider  aspirin EC 81 MG tablet Take by mouth.    [provider]  Calcium-Vitamin D-Vitamin K (VIACTIV PO) Take by mouth.    [provider]  furosemide (LASIX) 20 MG tablet  09/02/19   [provider]  hydrocortisone 2.5 % cream APPLY TO AFFECTED AREA ON THE SKIN TWICE A DAY TO FACE UNTIL CLEAR, THEN AS NEEDED FLARES 12/31/17   [provider]  OLANZapine (ZYPREXA) 7.5 MG tablet Take 7.5 mg by mouth at bedtime. 11/04/17   [provider]  Omega-3 Fatty Acids (FISH OIL PO) Take by mouth.    [provider]    Physical Exam: Vitals:   02/27/20 1407 02/27/20 1918 02/27/20 2032 02/27/20 2112  BP: (!) 174/96 (!) 163/89 (!) 137/94  Pulse:  83 91   Resp: (!) 28 18 18    Temp: 98.2 F (36.8 C) 98.3 F (36.8 C) 98.2 F (36.8 C)   TempSrc: Oral Oral Oral   SpO2: (!) 85% 97% (!) 88% 95%  Weight: 63.5 kg     Height: 4\' 11"  (1.499 m)        Vitals:   02/27/20 1407 02/27/20 1918 02/27/20 2032 02/27/20 2112  BP: (!) 174/96 (!) 163/89 (!) 137/94   Pulse:  83 91   Resp: (!) 28 18 18    Temp: 98.2 F (36.8 C) 98.3 F (36.8 C) 98.2 F (36.8 C)   TempSrc: Oral Oral Oral   SpO2: (!) 85% 97% (!) 88% 95%  Weight: 63.5 kg     Height: 4\' 11"  (1.499 m)         Constitutional: Alert and  oriented x 3 . Not in any apparent distress HEENT:      Head: Normocephalic and atraumatic.         Eyes: PERLA, EOMI, Conjunctivae are normal. Sclera is non-icteric.       Mouth/Throat: Mucous membranes are moist.       Neck: Supple with no signs of meningismus. Cardiovascular: Regular rate and rhythm. No murmurs, gallops, or rubs. 2+ symmetrical distal pulses are present . No JVD. No 1+LE edema Respiratory: Respiratory effort normal .Lungs sounds clear bilaterally. No wheezes, crackles, or rhonchi.  Gastrointestinal: Soft, non tender, and non distended with positive bowel sounds. No rebound or guarding. Genitourinary: No CVA tenderness. Musculoskeletal: Nontender with normal range of motion in all extremities. No cyanosis, or erythema of extremities. Neurologic: Normal speech and language. Face is symmetric. Moving all extremities. No gross focal neurologic deficits . Skin: Skin is warm, dry.  No rash or ulcers Psychiatric: Mood and affect are normal Speech and behavior are normal   Labs on Admission: I have personally reviewed following labs and imaging studies  CBC: Recent Labs  Lab 02/27/20 1404  WBC 8.8  HGB 16.7*  HCT 52.4*  MCV 95.6  PLT 353   Basic Metabolic Panel: Recent Labs  Lab 02/27/20 1404  NA 139  K 3.8  CL 100  CO2 25  GLUCOSE 93  BUN 26*  CREATININE 1.10*  CALCIUM 9.9   GFR: Estimated Creatinine Clearance: 31.4 mL/min (A) (by C-G formula based on SCr of 1.1 mg/dL (H)). Liver Function Tests: No results for input(s): AST, ALT, ALKPHOS, BILITOT, PROT, ALBUMIN in the last 168 hours. No results for input(s): LIPASE, AMYLASE in the last 168 hours. No results for input(s): AMMONIA in the last 168 hours. Coagulation Profile: No results for input(s): INR, PROTIME in the last 168 hours. Cardiac Enzymes: No results for input(s): CKTOTAL, CKMB, CKMBINDEX, TROPONINI in the last 168 hours. BNP (last 3 results) No results for input(s): PROBNP in the last 8760  hours. HbA1C: No results for input(s): HGBA1C in the last 72 hours. CBG: No results for input(s): GLUCAP in the last 168 hours. Lipid Profile: No results for input(s): CHOL, HDL, LDLCALC, TRIG, CHOLHDL, LDLDIRECT in the last 72 hours. Thyroid Function Tests: No results for input(s): TSH, T4TOTAL, FREET4, T3FREE, THYROIDAB in the last 72 hours. Anemia Panel: No results for input(s): VITAMINB12, FOLATE, FERRITIN, TIBC, IRON, RETICCTPCT in the last 72 hours. Urine analysis: No results found for: COLORURINE, APPEARANCEUR, LABSPEC, PHURINE, GLUCOSEU, HGBUR, BILIRUBINUR, KETONESUR, PROTEINUR, UROBILINOGEN, NITRITE, LEUKOCYTESUR  Radiological Exams on Admission: DG Chest 2 View  Result Date: 02/27/2020 CLINICAL DATA:  Shortness of breath  for the past 4 months. EXAM: CHEST - 2 VIEW COMPARISON:  None. FINDINGS: The patient is slightly rotated. The heart size and mediastinal contours are within normal limits. Normal pulmonary vascularity. No focal consolidation, pleural effusion, or pneumothorax. No acute osseous abnormality. IMPRESSION: No active cardiopulmonary disease. Electronically Signed   By: Titus Dubin M.D.   On: 02/27/2020 14:51   CT Angio Chest PE W and/or Wo Contrast  Result Date: 02/28/2020 CLINICAL DATA:  86 year old female with progressed shortness of breath over the past 2 weeks. EXAM: CT ANGIOGRAPHY CHEST WITH CONTRAST TECHNIQUE: Multidetector CT imaging of the chest was performed using the standard protocol during bolus administration of intravenous contrast. Multiplanar CT image reconstructions and MIPs were obtained to evaluate the vascular anatomy. CONTRAST:  13mL OMNIPAQUE IOHEXOL 350 MG/ML SOLN COMPARISON:  Chest radiographs tonight. FINDINGS: Cardiovascular: Excellent contrast bolus timing in the pulmonary arterial tree. Central pulmonary artery enlargement (series 5, image 116) suggesting a degree of pulmonary artery hypertension. No focal filling defect identified in the  pulmonary arteries to suggest acute pulmonary embolism. Mild cardiomegaly with appearance of right atrial enlargement (series 5, image 184). No pericardial effusion. Calcified coronary artery atherosclerosis. Little contrast in the aorta. Minimal thoracic aortic atherosclerosis. Mediastinum/Nodes: Negative. Lungs/Pleura: Major airways are patent. There is bilateral mosaic attenuation (series 6, image 44). Mild superimposed subpleural scarring is scattered bilaterally. Enhancing atelectasis in the left costophrenic angle. Enhancing atelectasis in the lingula. No area suspicious for pneumonia. Tiny calcified granuloma in the superior segment of the right lower lobe. Upper Abdomen: Negative visible liver, spleen, pancreas, adrenal glands, kidneys and bowel in the upper abdomen. Musculoskeletal: Scoliosis and exaggerated thoracic kyphosis. No acute osseous abnormality identified. Review of the MIP images confirms the above findings. IMPRESSION: 1. Negative for acute pulmonary embolus. 2. Central pulmonary artery enlargement suggesting a degree of pulmonary artery hypertension, and bilateral pulmonary mosaic attenuation which can be seen in the setting of chronic small airway or small vessel disease. Lung base atelectasis but no area suspicious for pneumonia. 3. Calcified coronary artery atherosclerosis. Mild cardiomegaly/right atrial enlargement. Electronically Signed   By: Genevie Ann M.D.   On: 02/28/2020 00:59    EKG: Independently reviewed. Interpretation : Normal sinus rhythm with no acute ST-T wave changes  Assessment/Plan 86 year old female with history of anxiety presenting with a several month history of shortness of breath becoming progressively worse over the past couple weeks   # Acute respiratory failure with hypoxia ([place]) with possible underlying chronic respiratory failure #Suspect new onset congestive heart failure with exacerbation versus right-sided heart failure/cor pulmonale related to  underlying chronic lung disease -Dyspnea on exertion, orthopnea and lower extremity edema with O2 sat 85% on room air, associated with tachypnea of 28 -CTA chest negative for PE but showing PAH -Suspect CHF, either right-sided or left-sided related to underlying interstitial pulmonary disease or OSA -Follow Covid PCR -Lasix 20 mg twice daily -Echocardiogram in the a.m. -Troponins negative so low suspicion for CAD -Daily weights with intake and output monitoring  Anxiety -Continue home meds  DVT prophylaxis: Lovenox Code Status: full code  Family Communication:  none  Disposition Plan: Back to previous home environment Consults called: none  Status: Observation    Athena Masse MD [facility]     02/28/2020, 1:46 AM

## 2020-02-28 NOTE — Progress Notes (Signed)
Brief hospitalist update note.  This is a nonbillable note.  Please see scanned H&P for full billable details.  Patient is an 86 year old female on relatively good level of health who presented to the emergency department with shortness of breath on exertion has been present for a few months but significantly worsened over the past week.  Exercise tolerance is progress to the point that she is unable to walk 10 minutes without becoming short of breath and needing to stop.  On admission her symptoms were relatively consistent with a fluid overloaded state and there were concerns for pulmonary arterial hypertension on her CT angio.  Echocardiogram reviewed by cardiology.  No systolic or diastolic dysfunction noted.  Continue to suspect a mild element of diastolic heart failure possibly mild pulmonary arterial hypertension.  Patient appears to be responding to diuretic therapy.  Symptoms improved.  Still dependent on 2 L nasal cannula.  Plan to continue intravenous diuresis and supportive care to attempt to wean from supplemental oxygen.  Ralene Muskrat MD

## 2020-02-28 NOTE — Evaluation (Signed)
Physical Therapy Evaluation Patient Details Name: Shannon Page MRN: [id number] DOB: [birth year] Today's Date: 02/28/2020   History of Present Illness  86 y/o F with PMH: anxiety who presented to ED w/ SOB on exertion that has been increasing over the past 2 weeks.  She reports a several month history of shortness of breath but with fair exercise tolerance up to about 30 minutes a day but now she cannot walk for more than 10 minutes without getting short of breath. W/u in ED including checking O2 sats which were 85% on room air. Pt currenlty satting 97% on 2Lnc.  Clinical Impression  Patient received sitting up in recliner finishing her lunch. She agrees to PT assessment. She is cooperative and pleasant. Requires min guard for sit to stand from recliner. Ambulates 100 feet with SPC and IV pole for support. Patient does not feel confident with SPC only. Min guard for safety with mobility.  She will continue to benefit from skilled PT while here to improve strength and balance for safe return home with husband.     Follow Up Recommendations Home health PT    Equipment Recommendations  None recommended by PT    Recommendations for Other Services       Precautions / Restrictions Precautions Precautions: Fall Precaution Comments: mod fall risk Restrictions Weight Bearing Restrictions: No      Mobility  Bed Mobility Overal bed mobility: Modified Independent             General bed mobility comments: Not assessed, patient in recliner and returned to recliner at end of session  Transfers Overall transfer level: Needs assistance Equipment used: None Transfers: Sit to/from Stand Sit to Stand: Min guard         General transfer comment: CGA with progress to SUPV  Ambulation/Gait Ambulation/Gait assistance: Min guard Gait Distance (Feet): 100 Feet Assistive device: Straight cane;IV Pole Gait Pattern/deviations: Step-to pattern;Shuffle;Decreased stride length Gait velocity:  decr   General Gait Details: Patient felt more comfortable with B UE support when walking. Holding to IV pole with left hand SPC in right hand. Slight unsteadiness noted with ambulation.  O2 sats dropping to mid 80%s with ambultion on room air.  Stairs            Wheelchair Mobility    Modified Rankin (Stroke Patients Only)       Balance Overall balance assessment: Mild deficits observed, not formally tested                                           Pertinent Vitals/Pain Pain Assessment: No/denies pain Pain Score: 2  Pain Location: Pt reports: little bit of cramping in LEs since starting the Lasix Pain Descriptors / Indicators: Discomfort Pain Intervention(s): Limited activity within patient's tolerance;Monitored during session    Home Living Family/patient expects to be discharged to:: Private residence Living Arrangements: Spouse/significant other Available Help at Discharge: Family;Available 24 hours/day Type of Home: House Home Access: Stairs to enter Entrance Stairs-Rails: Can reach both Entrance Stairs-Number of Steps: 2 Home Layout: One level Home Equipment: Cane - single point;Grab bars - tub/shower      Prior Function Level of Independence: Independent with assistive device(s)         Comments: endorses furniture cruising within the home and primarily using [place] out in community.     Hand Dominance  Extremity/Trunk Assessment   Upper Extremity Assessment Upper Extremity Assessment: Defer to OT evaluation    Lower Extremity Assessment Lower Extremity Assessment: Generalized weakness    Cervical / Trunk Assessment Cervical / Trunk Assessment: Kyphotic  Communication   Communication: No difficulties  Cognition Arousal/Alertness: Awake/alert Behavior During Therapy: WFL for tasks assessed/performed Overall Cognitive Status: Within Functional Limits for tasks assessed                                         General Comments General comments (skin integrity, edema, etc.): Pt's O2 at 97% on 2Lnc at rest, 93% on RA at rest. With fxl activity and fxl mobility, 89-90% on RA. With ~15 seconds of PLB education/trial with patient, spO2 increased to 93-94% on RA. RN notified and requests that [place] stay on at this time. OT educates re: use of Inceptinve spirometer.    Exercises Other Exercises Other Exercises: OT facilitates ed re: O2 monitoring with fxl activity at home, safety with fxl mobility/ADL transfers, and into to [facility] strategies. Pt with good understanding, would benefit from f/u re: EC strategies.   Assessment/Plan    PT Assessment Patient needs continued PT services  PT Problem List Decreased strength;Decreased mobility;Decreased activity tolerance;Decreased balance;Cardiopulmonary status limiting activity;Decreased knowledge of precautions       PT Treatment Interventions Therapeutic activities;DME instruction;Gait training;Therapeutic exercise;Patient/family education;Functional mobility training;Stair training;Balance training    PT Goals (Current goals can be found in the Care Plan section)  Acute Rehab PT Goals Patient Stated Goal: to go back to [facility] and get started back in balance class PT Goal Formulation: With patient Time For Goal Achievement: 03/06/20 Potential to Achieve Goals: Good    Frequency Min 2X/week   Barriers to discharge   lives with elderly husband at [facility]. She discussed doing balance training at [facility] when she returns, and looking into getting a walker if needed.    Co-evaluation               AM-PAC PT "6 Clicks" Mobility  Outcome Measure Help needed turning from your back to your side while in a flat bed without using bedrails?: None Help needed moving from lying on your back to sitting on the side of a flat bed without using bedrails?: A Little Help needed moving to and from a bed to a chair (including a wheelchair)?: A Little Help  needed standing up from a chair using your arms (e.g., wheelchair or bedside chair)?: A Little Help needed to walk in hospital room?: A Little Help needed climbing 3-5 steps with a railing? : A Little 6 Click Score: 19    End of Session Equipment Utilized During Treatment: Gait belt;Oxygen Activity Tolerance: Patient tolerated treatment well Patient left: in chair;with chair alarm set;with call bell/phone within reach;with family/visitor present Nurse Communication: Mobility status PT Visit Diagnosis: Unsteadiness on feet (R26.81);Muscle weakness (generalized) (M62.81);Difficulty in walking, not elsewhere classified (R26.2)    Time: 1345-1415 PT Time Calculation (min) (ACUTE ONLY): 30 min   Charges:   PT Evaluation $PT Eval Moderate Complexity: 1 Mod PT Treatments $Gait Training: 8-22 mins        [employer], PT, [facility] 02/28/20,2:22 PM

## 2020-02-29 LAB — BASIC METABOLIC PANEL
Anion gap: 9 (ref 5–15)
BUN: 28 mg/dL — ABNORMAL HIGH (ref 8–23)
CO2: 28 mmol/L (ref 22–32)
Calcium: 8.5 mg/dL — ABNORMAL LOW (ref 8.9–10.3)
Chloride: 101 mmol/L (ref 98–111)
Creatinine, Ser: 1.36 mg/dL — ABNORMAL HIGH (ref 0.44–1.00)
GFR calc Af Amer: 42 mL/min — ABNORMAL LOW (ref >60)
GFR calc non Af Amer: 36 mL/min — ABNORMAL LOW (ref >60)
Glucose, Bld: 95 mg/dL (ref 70–99)
Potassium: 4.1 mmol/L (ref 3.5–5.1)
Sodium: 138 mmol/L (ref 135–145)

## 2020-02-29 LAB — MAGNESIUM: Magnesium: 2.8 mg/dL — ABNORMAL HIGH (ref 1.7–2.4)

## 2020-02-29 MED ORDER — ENOXAPARIN SODIUM 30 MG/0.3ML ~~LOC~~ SOLN: 30.0000 mg | SUBCUTANEOUS | Status: DC

## 2020-02-29 MED ORDER — FUROSEMIDE 20 MG PO TABS
20.0000 mg | ORAL_TABLET | ORAL | 0 refills | Status: DC
Start: 2020-02-29 — End: 2020-08-12

## 2020-02-29 NOTE — Discharge Summary (Addendum)
Physician Discharge Summary  Shannon Page VOH:[id number] DOB: [birth year] DOA: 02/27/2020  PCP: Tracie Harrier, MD  Admit date: 02/27/2020 Discharge date: 02/29/2020  Admitted From: Home Disposition: Home with home health  Recommendations for Outpatient Follow-up:  1. Follow up with PCP in 1-2 weeks 2.   Home Health: No Equipment/Devices: None Discharge Condition: Stable CODE STATUS: Full Diet recommendation: Heart Healthy / Carb Modified Brief/Interim Summary: Patient is an 86 year old female on relatively good level of health who presented to the emergency department with shortness of breath on exertion has been present for a few months but significantly worsened over the past week.  Exercise tolerance is progress to the point that she is unable to walk 10 minutes without becoming short of breath and needing to stop.  On admission her symptoms were relatively consistent with a fluid overloaded state and there were concerns for pulmonary arterial hypertension on her CT angio.  Echocardiogram reviewed by cardiology.  No systolic or diastolic dysfunction noted.  Continue to suspect a mild element of diastolic heart failure possibly mild pulmonary arterial hypertension.  Patient appears to be responding to diuretic therapy.  Symptoms improved.  Still dependent on 2 L nasal cannula.  Plan to continue intravenous diuresis and supportive care to attempt to wean from supplemental oxygen.  8/12: Patient weaned successfully from nasal cannula.  Had a lengthy discussion with patient regarding possible etiologies for her shortness of breath and hypoxia.  Echocardiogram unrevealing however I suspect the patient had transient diastolic dysfunction possibly related to mild underlying pulmonary arterial hypertension.  Instructed patient to discuss this with her primary care physician.  Also recommending Lasix 20 mg q. OD for maintenance of volume status.  Patient may benefit from a referral to pulmonary  or cardiology post discharge for further evaluation of possible underlying pulmonary hypertension.  Discharge Diagnoses:  Principal Problem:   Acute respiratory failure with hypoxia (HCC) Active Problems:   Venous insufficiency, peripheral   Dyspnea on exertion   PAH (pulmonary artery hypertension) ([place]) - on CTA chest   Acute hypoxemic respiratory failure (HCC)  Acute hypoxic respiratory failure Suspected acute decompensated diastolic heart failure Patient had dyspnea on exertion, without any, mild lower extremity edema O2 sat in 80 on room air CTA chest negative for PE however demonstrated mild findings consistent with pulmonary hypertension No element of CHF on echocardiogram.  Normal diastolic parameters.  Ejection fraction 65% Was diuresis intravenously in house with good result Saturations improved Shortness of breath improved Suspect transient diastolic dysfunction possibly owing to underlying mild PAH Discharged on Lasix 20 mg q. OD Follow-up with PCP in 1 week Consider possible referral to pulmonary or cardiology for further evaluation  Physical deconditioning Oupatient orders: PT/OT- evaluate and treat    Discharge Instructions  Discharge Instructions    Diet - low sodium heart healthy   Complete by: As directed    Increase activity slowly   Complete by: As directed      Allergies as of 02/29/2020      Reactions   Moxifloxacin Other (See Comments)   tendonitis   Atenolol    Dizziness   Avelox [moxifloxacin Hcl In Nacl]    Clindamycin Rash   Clindamycin/lincomycin Rash   Latex Rash   Nystatin Rash   Penicillins Rash      Medication List    TAKE these medications   aspirin EC 81 MG tablet Take by mouth. Notes to patient: Not given while in hospital.     FISH OIL PO  Take 500 mg by mouth 2 (two) times daily. Notes to patient: Not given while in hospital.    furosemide 20 MG tablet Commonly known as: Lasix Take 1 tablet (20 mg total) by mouth  every other day. Notes to patient: Last dose given 02/28/2020 at 5:23pm   hydrocortisone 2.5 % cream APPLY TO AFFECTED AREA ON THE SKIN TWICE A DAY TO FACE UNTIL CLEAR, THEN AS NEEDED FLARES Notes to patient: Not given in hospital.    OLANZapine 7.5 MG tablet Commonly known as: ZYPREXA Take 7.5 mg by mouth at bedtime. Notes to patient: Not given while in hospital.    VIACTIV PO Take by mouth. Notes to patient: Not given while in hospital.        Allergies  Allergen Reactions  . Moxifloxacin Other (See Comments)    tendonitis  . Atenolol     Dizziness   . Avelox [Moxifloxacin Hcl In Nacl]   . Clindamycin Rash  . Clindamycin/Lincomycin Rash  . Latex Rash  . Nystatin Rash  . Penicillins Rash    Consultations:  None   Procedures/Studies: DG Chest 2 View  Result Date: 02/27/2020 CLINICAL DATA:  Shortness of breath for the past 4 months. EXAM: CHEST - 2 VIEW COMPARISON:  None. FINDINGS: The patient is slightly rotated. The heart size and mediastinal contours are within normal limits. Normal pulmonary vascularity. No focal consolidation, pleural effusion, or pneumothorax. No acute osseous abnormality. IMPRESSION: No active cardiopulmonary disease. Electronically Signed   By: Titus Dubin M.D.   On: 02/27/2020 14:51   CT Angio Chest PE W and/or Wo Contrast  Result Date: 02/28/2020 CLINICAL DATA:  86 year old female with progressed shortness of breath over the past 2 weeks. EXAM: CT ANGIOGRAPHY CHEST WITH CONTRAST TECHNIQUE: Multidetector CT imaging of the chest was performed using the standard protocol during bolus administration of intravenous contrast. Multiplanar CT image reconstructions and MIPs were obtained to evaluate the vascular anatomy. CONTRAST:  73mL OMNIPAQUE IOHEXOL 350 MG/ML SOLN COMPARISON:  Chest radiographs tonight. FINDINGS: Cardiovascular: Excellent contrast bolus timing in the pulmonary arterial tree. Central pulmonary artery enlargement (series 5, image  116) suggesting a degree of pulmonary artery hypertension. No focal filling defect identified in the pulmonary arteries to suggest acute pulmonary embolism. Mild cardiomegaly with appearance of right atrial enlargement (series 5, image 184). No pericardial effusion. Calcified coronary artery atherosclerosis. Little contrast in the aorta. Minimal thoracic aortic atherosclerosis. Mediastinum/Nodes: Negative. Lungs/Pleura: Major airways are patent. There is bilateral mosaic attenuation (series 6, image 44). Mild superimposed subpleural scarring is scattered bilaterally. Enhancing atelectasis in the left costophrenic angle. Enhancing atelectasis in the lingula. No area suspicious for pneumonia. Tiny calcified granuloma in the superior segment of the right lower lobe. Upper Abdomen: Negative visible liver, spleen, pancreas, adrenal glands, kidneys and bowel in the upper abdomen. Musculoskeletal: Scoliosis and exaggerated thoracic kyphosis. No acute osseous abnormality identified. Review of the MIP images confirms the above findings. IMPRESSION: 1. Negative for acute pulmonary embolus. 2. Central pulmonary artery enlargement suggesting a degree of pulmonary artery hypertension, and bilateral pulmonary mosaic attenuation which can be seen in the setting of chronic small airway or small vessel disease. Lung base atelectasis but no area suspicious for pneumonia. 3. Calcified coronary artery atherosclerosis. Mild cardiomegaly/right atrial enlargement. Electronically Signed   By: Genevie Ann M.D.   On: 02/28/2020 00:59   ECHOCARDIOGRAM COMPLETE  Result Date: 02/28/2020    ECHOCARDIOGRAM REPORT   Patient Name:   Shannon Page Cando Date of Exam: 02/28/2020 Medical  Rec #:  [id number]      Height:       59.0 in Accession #:    [id number]     Weight:       139.3 lb Date of Birth:  May 11, 1934      BSA:          1.582 m Patient Age:    66 years       BP:           126/89 mmHg Patient Gender: F              HR:           79 bpm. Exam  Location:  [facility] Procedure: 2D Echo, Color Doppler and Cardiac Doppler Indications:     R06.00 Dyspnea  History:         Patient has no prior history of Echocardiogram examinations.                  Signs/Symptoms:Leg edema.  Sonographer:     Charmayne Sheer RDCS (AE) Referring Phys:  [id number] Athena Masse Diagnosing Phys: Serafina Royals MD  Sonographer Comments: Suboptimal apical window and suboptimal subcostal window. IMPRESSIONS  1. Left ventricular ejection fraction, by estimation, is 60 to 65%. The left ventricle has normal function. The left ventricle has no regional wall motion abnormalities. Left ventricular diastolic parameters were normal.  2. Right ventricular systolic function is normal. The right ventricular size is mildly enlarged.  3. The mitral valve is normal in structure. Trivial mitral valve regurgitation.  4. The aortic valve is normal in structure. Aortic valve regurgitation is not visualized. FINDINGS  Left Ventricle: Left ventricular ejection fraction, by estimation, is 60 to 65%. The left ventricle has normal function. The left ventricle has no regional wall motion abnormalities. The left ventricular internal cavity size was normal in size. There is  no left ventricular hypertrophy. Left ventricular diastolic parameters were normal. Right Ventricle: The right ventricular size is mildly enlarged. No increase in right ventricular wall thickness. Right ventricular systolic function is normal. Left Atrium: Left atrial size was normal in size. Right Atrium: Right atrial size was normal in size. Pericardium: There is no evidence of pericardial effusion. Mitral Valve: The mitral valve is normal in structure. Trivial mitral valve regurgitation. MV peak gradient, 3.7 mmHg. The mean mitral valve gradient is 1.0 mmHg. Tricuspid Valve: The tricuspid valve is normal in structure. Tricuspid valve regurgitation is mild. Aortic Valve: The aortic valve is normal in structure. Aortic valve regurgitation is not  visualized. Aortic valve mean gradient measures 2.0 mmHg. Aortic valve peak gradient measures 4.8 mmHg. Aortic valve area, by VTI measures 2.42 cm. Pulmonic Valve: The pulmonic valve was normal in structure. Pulmonic valve regurgitation is not visualized. Aorta: The aortic root and ascending aorta are structurally normal, with no evidence of dilitation. IAS/Shunts: No atrial level shunt detected by color flow Doppler.  LEFT VENTRICLE PLAX 2D LVIDd:         3.39 cm  Diastology LVIDs:         2.22 cm  LV e' lateral:   8.59 cm/s LV PW:         0.77 cm  LV E/e' lateral: 7.0 LV IVS:        0.87 cm  LV e' medial:    4.57 cm/s LVOT diam:     1.80 cm  LV E/e' medial:  13.1 LV SV:         49 LV SV Index:  31 LVOT Area:     2.54 cm  LEFT ATRIUM             Index       RIGHT ATRIUM           Index LA diam:        1.90 cm 1.20 cm/m  RA Area:     17.30 cm LA Vol (A2C):   28.9 ml 18.27 ml/m RA Volume:   47.00 ml  29.71 ml/m LA Vol (A4C):   35.7 ml 22.57 ml/m LA Biplane Vol: 32.6 ml 20.61 ml/m  AORTIC VALVE                   PULMONIC VALVE AV Area (Vmax):    2.11 cm    PV Vmax:       0.62 m/s AV Area (Vmean):   2.06 cm    PV Vmean:      46.100 cm/s AV Area (VTI):     2.42 cm    PV VTI:        0.106 m AV Vmax:           110.00 cm/s PV Peak grad:  1.6 mmHg AV Vmean:          72.100 cm/s PV Mean grad:  1.0 mmHg AV VTI:            0.204 m AV Peak Grad:      4.8 mmHg AV Mean Grad:      2.0 mmHg LVOT Vmax:         91.40 cm/s LVOT Vmean:        58.400 cm/s LVOT VTI:          0.194 m LVOT/AV VTI ratio: 0.95  AORTA Ao Root diam: 3.60 cm MITRAL VALVE MV Area (PHT): 2.91 cm     SHUNTS MV Peak grad:  3.7 mmHg     Systemic VTI:  0.19 m MV Mean grad:  1.0 mmHg     Systemic Diam: 1.80 cm MV Vmax:       0.96 m/s MV Vmean:      57.1 cm/s MV Decel Time: 261 msec MV E velocity: 60.00 cm/s MV A velocity: 111.00 cm/s MV E/A ratio:  0.54 Serafina Royals MD Electronically signed by Serafina Royals MD Signature Date/Time: 02/28/2020/1:12:32 PM     Final     (Echo, Carotid, EGD, Colonoscopy, ERCP)    Subjective: Seen and examined on the day of discharge.  Weaned off supplemental oxygen.  No complaints.  Stable for discharge home.  Home health PT ordered.  Discharge Exam: Vitals:   02/28/20 2345 02/29/20 0750  BP: 98/67 105/75  Pulse: 81 74  Resp: 20 17  Temp: 97.6 F (36.4 C) 97.7 F (36.5 C)  SpO2: 95% 93%   Vitals:   02/28/20 1549 02/28/20 2345 02/29/20 0651 02/29/20 0750  BP: 102/68 98/67  105/75  Pulse: 74 81  74  Resp: 16 20  17   Temp: 98 F (36.7 C) 97.6 F (36.4 C)  97.7 F (36.5 C)  TempSrc: Oral Oral  Oral  SpO2: 96% 95%  93%  Weight:   62.6 kg   Height:        General: Pt is alert, awake, not in acute distress Cardiovascular: RRR, S1/S2 +, no rubs, no gallops Respiratory: CTA bilaterally, no wheezing, no rhonchi Abdominal: Soft, NT, ND, bowel sounds + Extremities: no edema, no cyanosis    The results of significant diagnostics from this  hospitalization (including imaging, microbiology, ancillary and laboratory) are listed below for reference.     Microbiology: Recent Results (from the past 240 hour(s))  SARS Coronavirus 2 by RT PCR (hospital order, performed in [facility] hospital lab) Nasopharyngeal Nasopharyngeal Swab     Status: None   Collection Time: 02/28/20 12:27 AM   Specimen: Nasopharyngeal Swab  Result Value Ref Range Status   SARS Coronavirus 2 NEGATIVE NEGATIVE Final    Comment: (NOTE) SARS-CoV-2 target nucleic acids are NOT DETECTED.  The SARS-CoV-2 RNA is generally detectable in upper and lower respiratory specimens during the acute phase of infection. The lowest concentration of SARS-CoV-2 viral copies this assay can detect is 250 copies / mL. A negative result does not preclude SARS-CoV-2 infection and should not be used as the sole basis for treatment or other patient management decisions.  A negative result may occur with improper specimen collection / handling,  submission of specimen other than nasopharyngeal swab, presence of viral mutation(s) within the areas targeted by this assay, and inadequate number of viral copies (<250 copies / mL). A negative result must be combined with clinical observations, patient history, and epidemiological information.  Fact Sheet for Patients:   StrictlyIdeas.no  Fact Sheet for Healthcare Providers: BankingDealers.co.za  This test is not yet approved or  cleared by the [place] FDA and has been authorized for detection and/or diagnosis of SARS-CoV-2 by FDA under an Emergency Use Authorization (EUA).  This EUA will remain in effect (meaning this test can be used) for the duration of the COVID-19 declaration under Section 564(b)(1) of the Act, 21 U.S.C. section 360bbb-3(b)(1), unless the authorization is terminated or revoked sooner.  Performed at [facility], [address] [place], [place] [address]      Labs: BNP (last 3 results) Recent Labs    02/28/20 0027  BNP 025.8*   Basic Metabolic Panel: Recent Labs  Lab 02/27/20 1404 02/28/20 0448 02/29/20 0444  NA 139 139 138  K 3.8 4.0 4.1  CL 100 102 101  CO2 25 28 28   GLUCOSE 93 91 95  BUN 26* 25* 28*  CREATININE 1.10* 1.08* 1.36*  CALCIUM 9.9 8.8* 8.5*  MG  --   --  2.8*   Liver Function Tests: Recent Labs  Lab 02/27/20 1924  AST 38  ALT 36  ALKPHOS 65  BILITOT 0.9  PROT 8.0  ALBUMIN 4.4   No results for input(s): LIPASE, AMYLASE in the last 168 hours. No results for input(s): AMMONIA in the last 168 hours. CBC: Recent Labs  Lab 02/27/20 1404 02/28/20 0448  WBC 8.8 6.7  HGB 16.7* 15.6*  HCT 52.4* 47.5*  MCV 95.6 94.8  PLT 216 186   Cardiac Enzymes: No results for input(s): CKTOTAL, CKMB, CKMBINDEX, TROPONINI in the last 168 hours. BNP: Invalid input(s): POCBNP CBG: No results for input(s): GLUCAP in the last 168 hours. D-Dimer No results for input(s):  DDIMER in the last 72 hours. Hgb A1c No results for input(s): HGBA1C in the last 72 hours. Lipid Profile No results for input(s): CHOL, HDL, LDLCALC, TRIG, CHOLHDL, LDLDIRECT in the last 72 hours. Thyroid function studies No results for input(s): TSH, T4TOTAL, T3FREE, THYROIDAB in the last 72 hours.  Invalid input(s): FREET3 Anemia work up No results for input(s): VITAMINB12, FOLATE, FERRITIN, TIBC, IRON, RETICCTPCT in the last 72 hours. Urinalysis No results found for: COLORURINE, APPEARANCEUR, LABSPEC, [place], GLUCOSEU, HGBUR, BILIRUBINUR, KETONESUR, PROTEINUR, UROBILINOGEN, NITRITE, LEUKOCYTESUR Sepsis Labs Invalid input(s): PROCALCITONIN,  WBC,  [place] [facility] Recent Results (from the past 240 hour(s))  SARS Coronavirus 2 by RT PCR (hospital order, performed in [facility] hospital lab) Nasopharyngeal Nasopharyngeal Swab     Status: None   Collection Time: 02/28/20 12:27 AM   Specimen: Nasopharyngeal Swab  Result Value Ref Range Status   SARS Coronavirus 2 NEGATIVE NEGATIVE Final    Comment: (NOTE) SARS-CoV-2 target nucleic acids are NOT DETECTED.  The SARS-CoV-2 RNA is generally detectable in upper and lower respiratory specimens during the acute phase of infection. The lowest concentration of SARS-CoV-2 viral copies this assay can detect is 250 copies / mL. A negative result does not preclude SARS-CoV-2 infection and should not be used as the sole basis for treatment or other patient management decisions.  A negative result may occur with improper specimen collection / handling, submission of specimen other than nasopharyngeal swab, presence of viral mutation(s) within the areas targeted by this assay, and inadequate number of viral copies (<250 copies / mL). A negative result must be combined with clinical observations, patient history, and epidemiological information.  Fact Sheet for Patients:   StrictlyIdeas.no  Fact Sheet for  Healthcare Providers: BankingDealers.co.za  This test is not yet approved or  cleared by the [place] FDA and has been authorized for detection and/or diagnosis of SARS-CoV-2 by FDA under an Emergency Use Authorization (EUA).  This EUA will remain in effect (meaning this test can be used) for the duration of the COVID-19 declaration under Section 564(b)(1) of the Act, 21 U.S.C. section 360bbb-3(b)(1), unless the authorization is terminated or revoked sooner.  Performed at [facility], [address], [place], [place] [address]      Time coordinating discharge: Over 30 minutes  SIGNED:   Sidney Ace, MD  [facility] 02/29/2020, 3:22 PM Pager   If 7PM-7AM, please contact night-coverage

## 2020-02-29 NOTE — Progress Notes (Signed)
PT Cancellation Note  Patient Details Name: DANEKA LANTIGUA MRN: [id number] DOB: 04-10-1934   Cancelled Treatment:    Reason Eval/Treat Not Completed: Patient declined, no reason specified. Patient declined, states she is leaving around lunchtime and was up to the bathroom earlier. Will continue to follow in case discharge does not happen today. No discharge note in at this time.     Samhitha Rosen 02/29/2020, 11:24 AM

## 2020-02-29 NOTE — Progress Notes (Signed)
Pt d/c home via private vehicle.  NAD noted at time of d/c IV to R wrist removed without issue.  D/c paperwork reviewed with pt and she expressed understanding.  Pt wheeled to med mall ent and assisted into car.  All personal belongings were taken at time of d/c.

## 2020-02-29 NOTE — Clinical Social Work Note (Addendum)
Patient left hospital before CSW could offer home health. CSW called and left her a voicemail.  Dayton Scrape, [place]  2:49 pm Just spoke with patient. She declined home health but said she is interested in outpatient PT at [facility]. CSW left message for admissions coordinator to see if they would need anything from the hospital to get that set up.  Dayton Scrape, [place]  3:51 pm Faxed discharge summary to [facility] which includes outpatient orders for PT/OT eval and treat. CSW signing off.  Dayton Scrape, [place]

## 2020-03-20 ENCOUNTER — Ambulatory Visit: Payer: Medicare Other | Admitting: Podiatry

## 2020-05-07 ENCOUNTER — Encounter: Payer: Medicare Other | Attending: Pulmonary Disease

## 2020-05-07 ENCOUNTER — Other Ambulatory Visit: Payer: Self-pay

## 2020-05-07 DIAGNOSIS — M419 Scoliosis, unspecified: Secondary | ICD-10-CM

## 2020-05-07 DIAGNOSIS — J984 Other disorders of lung: Secondary | ICD-10-CM

## 2020-05-07 DIAGNOSIS — R1310 Dysphagia, unspecified: Secondary | ICD-10-CM | POA: Insufficient documentation

## 2020-05-07 NOTE — Progress Notes (Signed)
Virtual Visit completed. Patient informed on EP and RD appointment and 6 Minute walk test. Patient also informed of patient health questionnaires on My Chart. Patient Verbalizes understanding. Visit diagnosis can be found in [facility] 04/11/2020.

## 2020-05-13 ENCOUNTER — Other Ambulatory Visit: Payer: Self-pay

## 2020-05-13 ENCOUNTER — Ambulatory Visit (INDEPENDENT_AMBULATORY_CARE_PROVIDER_SITE_OTHER): Payer: Medicare Other | Admitting: Podiatry

## 2020-05-13 ENCOUNTER — Encounter: Payer: Medicare Other | Admitting: *Deleted

## 2020-05-13 VITALS — Ht 59.5 in | Wt 138.9 lb

## 2020-05-13 DIAGNOSIS — M419 Scoliosis, unspecified: Secondary | ICD-10-CM

## 2020-05-13 DIAGNOSIS — R1310 Dysphagia, unspecified: Secondary | ICD-10-CM | POA: Diagnosis present

## 2020-05-13 DIAGNOSIS — Q828 Other specified congenital malformations of skin: Secondary | ICD-10-CM

## 2020-05-13 DIAGNOSIS — B351 Tinea unguium: Secondary | ICD-10-CM | POA: Diagnosis not present

## 2020-05-13 DIAGNOSIS — M79609 Pain in unspecified limb: Secondary | ICD-10-CM

## 2020-05-13 DIAGNOSIS — J984 Other disorders of lung: Secondary | ICD-10-CM

## 2020-05-13 NOTE — Progress Notes (Signed)
She presents today chief complaint of painfully elongated toenails 1 through 5 bilaterally.  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  Assessment: Pain in limb secondary to deformity and resultant porokeratosis and painful elongated toenails.  Plan: Debridement of toenails and reactive hyperkeratotic lesions.  Follow-up with her in 3 months

## 2020-05-13 NOTE — Progress Notes (Signed)
   05/13/20 1453  6 Minute Walk  Phase Initial  Distance 700 feet  Walk Time 6 minutes  # of Rest Breaks 0  MPH 1.33  METS 1.15  RPE 13  Perceived Dyspnea  3  VO2 Peak 4.04  Symptoms Yes (comment)  Comments SOB, shuffles feet, desaturations  Resting HR 76 bpm  Resting BP 136/68  Resting Oxygen Saturation  91 %  Exercise Oxygen Saturation  during 6 min walk 82 %  Max Ex. HR 105 bpm  Max Ex. BP 146/74  2 Minute Post BP 136/74  Interval HR  Interval Heart Rate? Yes  1 Minute HR 91  2 Minute HR 96  3 Minute HR 99  4 Minute HR 103  5 Minute HR 104  6 Minute HR 105  2 Minute Post HR 94  Interval Oxygen  Interval Oxygen? Yes  Baseline Oxygen Saturation % 91 %  1 Minute Oxygen Saturation % 86 %  1 Minute Liters of Oxygen 0 L (Room Air)  2 Minute Oxygen Saturation % 84 %  2 Minute Liters of Oxygen 0 L  3 Minute Oxygen Saturation % 83 %  3 Minute Liters of Oxygen 0 L  4 Minute Oxygen Saturation % 83 %  4 Minute Liters of Oxygen 0 L  5 Minute Oxygen Saturation % 82 %  5 Minute Liters of Oxygen 0 L  6 Minute Oxygen Saturation % 82 %  6 Minute Liters of Oxygen 0 L  2 Minute Post Oxygen Saturation % 86 %  2 Minute Post Liters of Oxygen 0 L   Walk Test Results from 05/13/20  Pt desaturated on room air during 6MWT in Pulmonary Rehab.  She never dropped too low to end test but remained below 88% during the entire test.  Sent to MD to review for possibility of oxygen therapy during exertion.   Alberteen Sam, [place], [place], CCRP 05/13/2020 2:58 PM

## 2020-05-13 NOTE — Progress Notes (Signed)
Pulmonary Individual Treatment Plan  Patient Details  Name: Shannon Page MRN: [id number] Date of Birth: [birth year] Referring Provider:     Pulmonary Rehab from 05/13/2020 in [facility] Cardiac and Pulmonary Rehab  Referring Provider Lanney Gins, Fraud MD      Initial Encounter Date:    Pulmonary Rehab from 05/13/2020 in [facility] Cardiac and Pulmonary Rehab  Date 05/13/20      Visit Diagnosis: Restrictive lung disease due to kyphoscoliosis  Patient's Home Medications on Admission:  Current Outpatient Medications:  .  albuterol (VENTOLIN HFA) 108 (90 Base) MCG/ACT inhaler, Inhale into the lungs., Disp: , Rfl:  .  aspirin EC 81 MG tablet, Take by mouth., Disp: , Rfl:  .  Calcium-Vitamin D-Vitamin K (VIACTIV PO), Take by mouth., Disp: , Rfl:  .  furosemide (LASIX) 20 MG tablet, Take 1 tablet (20 mg total) by mouth every other day., Disp: 30 tablet, Rfl: 0 .  furosemide (LASIX) 20 MG tablet, Take by mouth., Disp: , Rfl:  .  hydrocortisone 2.5 % cream, APPLY TO AFFECTED AREA ON THE SKIN TWICE A DAY TO FACE UNTIL CLEAR, THEN AS NEEDED FLARES (Patient not taking: Reported on 05/07/2020), Disp: , Rfl: 1 .  OLANZapine (ZYPREXA) 7.5 MG tablet, Take 7.5 mg by mouth at bedtime., Disp: , Rfl: 3 .  Omega-3 Fatty Acids (FISH OIL PO), Take 500 mg by mouth 2 (two) times daily. , Disp: , Rfl:   Past Medical History: Past Medical History:  Diagnosis Date  . Anxiety     Tobacco Use: Social History   Tobacco Use  Smoking Status Former Smoker  . Packs/day: 1.00  . Years: 5.00  . Pack years: 5.00  . Types: Cigarettes  . Quit date: 08/07/1961  . Years since quitting: 58.8  Smokeless Tobacco Never Used    Labs: Recent Review Flowsheet Data   There is no flowsheet data to display.      Pulmonary Assessment Scores:  Pulmonary Assessment Scores    Row Name 05/13/20 1509         ADL UCSD   ADL Phase Entry     SOB Score total 42     Rest 2     Walk 3     Stairs 4     Bath 0     Dress 0       Shop 2       CAT Score   CAT Score 13       mMRC Score   mMRC Score 1            UCSD: Self-administered rating of dyspnea associated with activities of daily living (ADLs) 6-point scale (0 = "not at all" to 5 = "maximal or unable to do because of breathlessness")  Scoring Scores range from 0 to 120.  Minimally important difference is 5 units  CAT: CAT can identify the health impairment of COPD patients and is better correlated with disease progression.  CAT has a scoring range of zero to 40. The CAT score is classified into four groups of low (less than 10), medium (10 - 20), high (21-30) and very high (31-40) based on the impact level of disease on health status. A CAT score over 10 suggests significant symptoms.  A worsening CAT score could be explained by an exacerbation, poor medication adherence, poor inhaler technique, or progression of COPD or comorbid conditions.  CAT MCID is 2 points  mMRC: mMRC (Modified Medical Research Council) Dyspnea Scale is used to assess the  degree of baseline functional disability in patients of respiratory disease due to dyspnea. No minimal important difference is established. A decrease in score of 1 point or greater is considered a positive change.   Pulmonary Function Assessment:  Pulmonary Function Assessment - 05/07/20 0939      Breath   Shortness of Breath Yes;Limiting activity           Exercise Target Goals: Exercise Program Goal: Individual exercise prescription set using results from initial 6 min walk test and THRR while considering  patient's activity barriers and safety.   Exercise Prescription Goal: Initial exercise prescription builds to 30-45 minutes a day of aerobic activity, 2-3 days per week.  Home exercise guidelines will be given to patient during program as part of exercise prescription that the participant will acknowledge.  Education: Aerobic Exercise & Resistance Training: - Gives group verbal and written  instruction on the various components of exercise. Focuses on aerobic and resistive training programs and the benefits of this training and how to safely progress through these programs..   Education: Exercise & Equipment Safety: - Individual verbal instruction and demonstration of equipment use and safety with use of the equipment.   Pulmonary Rehab from 05/13/2020 in [facility] Cardiac and Pulmonary Rehab  Date 05/07/20  Educator [facility]  Instruction Review Code 1- Verbalizes Understanding      Education: Exercise Physiology & General Exercise Guidelines: - Group verbal and written instruction with models to review the exercise physiology of the cardiovascular system and associated critical values. Provides general exercise guidelines with specific guidelines to those with heart or lung disease.    Education: Flexibility, Balance, Mind/Body Relaxation: Provides group verbal/written instruction on the benefits of flexibility and balance training, including mind/body exercise modes such as yoga, pilates and tai chi.  Demonstration and skill practice provided.   Activity Barriers & Risk Stratification:  Activity Barriers & Cardiac Risk Stratification - 05/13/20 1503      Activity Barriers & Cardiac Risk Stratification   Activity Barriers Deconditioning;Muscular Weakness;Shortness of Breath;Balance Concerns;Assistive Device;Neck/Spine Problems;Back Problems           6 Minute Walk:  6 Minute Walk    Row Name 05/13/20 1453         6 Minute Walk   Phase Initial     Distance 700 feet     Walk Time 6 minutes     # of Rest Breaks 0     MPH 1.33     METS 1.15     RPE 13     Perceived Dyspnea  3     VO2 Peak 4.04     Symptoms Yes (comment)     Comments SOB, shuffles feet, desaturations     Resting HR 76 bpm     Resting BP 136/68     Resting Oxygen Saturation  91 %     Exercise Oxygen Saturation  during 6 min walk 82 %     Max Ex. HR 105 bpm     Max Ex. BP 146/74     2 Minute Post BP  136/74       Interval HR   1 Minute HR 91     2 Minute HR 96     3 Minute HR 99     4 Minute HR 103     5 Minute HR 104     6 Minute HR 105     2 Minute Post HR 94     Interval Heart Rate?  Yes       Interval Oxygen   Interval Oxygen? Yes     Baseline Oxygen Saturation % 91 %     1 Minute Oxygen Saturation % 86 %     1 Minute Liters of Oxygen 0 L  Room Air     2 Minute Oxygen Saturation % 84 %     2 Minute Liters of Oxygen 0 L     3 Minute Oxygen Saturation % 83 %     3 Minute Liters of Oxygen 0 L     4 Minute Oxygen Saturation % 83 %     4 Minute Liters of Oxygen 0 L     5 Minute Oxygen Saturation % 82 %     5 Minute Liters of Oxygen 0 L     6 Minute Oxygen Saturation % 82 %     6 Minute Liters of Oxygen 0 L     2 Minute Post Oxygen Saturation % 86 %     2 Minute Post Liters of Oxygen 0 L           Oxygen Initial Assessment:  Oxygen Initial Assessment - 05/07/20 0938      Home Oxygen   Home Oxygen Device None    Sleep Oxygen Prescription None    Home Exercise Oxygen Prescription None    Home Resting Oxygen Prescription None    Compliance with Home Oxygen Use Yes      Initial 6 min Walk   Oxygen Used None      Program Oxygen Prescription   Program Oxygen Prescription None      Intervention   Short Term Goals To learn and demonstrate proper use of respiratory medications;To learn and demonstrate proper pursed lip breathing techniques or other breathing techniques.;To learn and understand importance of maintaining oxygen saturations>88%;To learn and understand importance of monitoring SPO2 with pulse oximeter and demonstrate accurate use of the pulse oximeter.    Long  Term Goals Maintenance of O2 saturations>88%;Compliance with respiratory medication;Demonstrates proper use of MDI's;Exhibits proper breathing techniques, such as pursed lip breathing or other method taught during program session;Verbalizes importance of monitoring SPO2 with pulse oximeter and return  demonstration           Oxygen Re-Evaluation:   Oxygen Discharge (Final Oxygen Re-Evaluation):   Initial Exercise Prescription:  Initial Exercise Prescription - 05/13/20 1500      Date of Initial Exercise RX and Referring Provider   Date 05/13/20    Referring Provider Lanney Gins, Fraud MD      Treadmill   MPH 1.2    Grade 0    Minutes 15    METs 1.92      Recumbant Bike   Level 1    RPM 50    Watts 10    Minutes 15    METs 1.5      NuStep   Level 1    SPM 80    Minutes 15    METs 1.5      T5 Nustep   Level 1    SPM 80    Minutes 15    METs 1.5      Prescription Details   Frequency (times per week) 3    Duration Progress to 30 minutes of continuous aerobic without signs/symptoms of physical distress      Intensity   THRR 40-80% of Max Heartrate 100-125    Ratings of Perceived Exertion 11-13    Perceived Dyspnea 0-4  Progression   Progression Continue to progress workloads to maintain intensity without signs/symptoms of physical distress.      Resistance Training   Training Prescription Yes    Weight 3 lb    Reps 10-15           Perform Capillary Blood Glucose checks as needed.  Exercise Prescription Changes:  Exercise Prescription Changes    Row Name 05/13/20 1500             Response to Exercise   Blood Pressure (Admit) 136/68       Blood Pressure (Exercise) 146/74       Blood Pressure (Exit) 128/60       Heart Rate (Admit) 76 bpm       Heart Rate (Exercise) 105 bpm       Heart Rate (Exit) 90 bpm       Oxygen Saturation (Admit) 91 %       Oxygen Saturation (Exercise) 82 %       Oxygen Saturation (Exit) 90 %       Rating of Perceived Exertion (Exercise) 13       Perceived Dyspnea (Exercise) 3       Symptoms SOB       Comments walk test results              Exercise Comments:   Exercise Goals and Review:  Exercise Goals    Row Name 05/13/20 1505             Exercise Goals   Increase Physical Activity Yes        Intervention Provide advice, education, support and counseling about physical activity/exercise needs.;Develop an individualized exercise prescription for aerobic and resistive training based on initial evaluation findings, risk stratification, comorbidities and participant's personal goals.       Expected Outcomes Short Term: Attend rehab on a regular basis to increase amount of physical activity.;Long Term: Add in home exercise to make exercise part of routine and to increase amount of physical activity.;Long Term: Exercising regularly at least 3-5 days a week.       Increase Strength and Stamina Yes       Intervention Provide advice, education, support and counseling about physical activity/exercise needs.;Develop an individualized exercise prescription for aerobic and resistive training based on initial evaluation findings, risk stratification, comorbidities and participant's personal goals.       Expected Outcomes Short Term: Increase workloads from initial exercise prescription for resistance, speed, and METs.;Short Term: Perform resistance training exercises routinely during rehab and add in resistance training at home;Long Term: Improve cardiorespiratory fitness, muscular endurance and strength as measured by increased METs and functional capacity (6MWT)       Able to understand and use rate of perceived exertion (RPE) scale Yes       Intervention Provide education and explanation on how to use RPE scale       Expected Outcomes Short Term: Able to use RPE daily in rehab to express subjective intensity level;Long Term:  Able to use RPE to guide intensity level when exercising independently       Able to understand and use Dyspnea scale Yes       Intervention Provide education and explanation on how to use Dyspnea scale       Expected Outcomes Short Term: Able to use Dyspnea scale daily in rehab to express subjective sense of shortness of breath during exertion;Long Term: Able to use Dyspnea scale to  guide intensity level when  exercising independently       Knowledge and understanding of Target Heart Rate Range (THRR) Yes       Intervention Provide education and explanation of THRR including how the numbers were predicted and where they are located for reference       Expected Outcomes Short Term: Able to state/look up THRR;Short Term: Able to use daily as guideline for intensity in rehab;Long Term: Able to use THRR to govern intensity when exercising independently       Able to check pulse independently Yes       Intervention Provide education and demonstration on how to check pulse in carotid and radial arteries.;Review the importance of being able to check your own pulse for safety during independent exercise       Expected Outcomes Short Term: Able to explain why pulse checking is important during independent exercise;Long Term: Able to check pulse independently and accurately       Understanding of Exercise Prescription Yes       Intervention Provide education, explanation, and written materials on patient's individual exercise prescription       Expected Outcomes Long Term: Able to explain home exercise prescription to exercise independently;Short Term: Able to explain program exercise prescription              Exercise Goals Re-Evaluation :   Discharge Exercise Prescription (Final Exercise Prescription Changes):  Exercise Prescription Changes - 05/13/20 1500      Response to Exercise   Blood Pressure (Admit) 136/68    Blood Pressure (Exercise) 146/74    Blood Pressure (Exit) 128/60    Heart Rate (Admit) 76 bpm    Heart Rate (Exercise) 105 bpm    Heart Rate (Exit) 90 bpm    Oxygen Saturation (Admit) 91 %    Oxygen Saturation (Exercise) 82 %    Oxygen Saturation (Exit) 90 %    Rating of Perceived Exertion (Exercise) 13    Perceived Dyspnea (Exercise) 3    Symptoms SOB    Comments walk test results           Nutrition:  Target Goals: Understanding of nutrition  guidelines, daily intake of sodium <1573m, cholesterol <2056m calories 30% from fat and 7% or less from saturated fats, daily to have 5 or more servings of fruits and vegetables.  Education: Controlling Sodium/Reading Food Labels -Group verbal and written material supporting the discussion of sodium use in heart healthy nutrition. Review and explanation with models, verbal and written materials for utilization of the food label.   Education: General Nutrition Guidelines/Fats and Fiber: -Group instruction provided by verbal, written material, models and posters to present the general guidelines for heart healthy nutrition. Gives an explanation and review of dietary fats and fiber.   Biometrics:  Pre Biometrics - 05/13/20 1507      Pre Biometrics   Height 4' 11.5" (1.511 m)    Weight 138 lb 14.4 oz (63 kg)    BMI (Calculated) 27.6    Single Leg Stand 1.6 seconds            Nutrition Therapy Plan and Nutrition Goals:   Nutrition Assessments:  Nutrition Assessments - 05/13/20 1507      MEDFICTS Scores   Pre Score 99           MEDIFICTS Score Key:          ?70 Need to make dietary changes          40-70 Heart Healthy Diet         ?  40 Therapeutic Level Cholesterol Diet  Nutrition Goals Re-Evaluation:   Nutrition Goals Discharge (Final Nutrition Goals Re-Evaluation):   Psychosocial: Target Goals: Acknowledge presence or absence of significant depression and/or stress, maximize coping skills, provide positive support system. Participant is able to verbalize types and ability to use techniques and skills needed for reducing stress and depression.   Education: Depression - Provides group verbal and written instruction on the correlation between heart/lung disease and depressed mood, treatment options, and the stigmas associated with seeking treatment.   Education: Sleep Hygiene -Provides group verbal and written instruction about how sleep can affect your health.   Define sleep hygiene, discuss sleep cycles and impact of sleep habits. Review good sleep hygiene tips.    Education: Stress and Anxiety: - Provides group verbal and written instruction about the health risks of elevated stress and causes of high stress.  Discuss the correlation between heart/lung disease and anxiety and treatment options. Review healthy ways to manage with stress and anxiety.   Initial Review & Psychosocial Screening:  Initial Psych Review & Screening - 05/07/20 0940      Initial Review   Current issues with Current Psychotropic Meds      Family Dynamics   Good Support System? Yes    Comments She can look to her Husband that live at [facility]. Her son lives nearby and she talkes to her daughter in [place]. She has a postive outlook on her health and mental health.      Barriers   Psychosocial barriers to participate in program There are no identifiable barriers or psychosocial needs.;The patient should benefit from training in stress management and relaxation.      Screening Interventions   Interventions To provide support and resources with identified psychosocial needs;Encouraged to exercise;Provide feedback about the scores to participant    Expected Outcomes Short Term goal: Utilizing psychosocial counselor, staff and physician to assist with identification of specific Stressors or current issues interfering with healing process. Setting desired goal for each stressor or current issue identified.;Long Term Goal: Stressors or current issues are controlled or eliminated.;Long Term goal: The participant improves quality of Life and PHQ9 Scores as seen by post scores and/or verbalization of changes;Short Term goal: Identification and review with participant of any Quality of Life or Depression concerns found by scoring the questionnaire.           Quality of Life Scores:  Scores of 19 and below usually indicate a poorer quality of life in these areas.  A difference  of  2-3 points is a clinically meaningful difference.  A difference of 2-3 points in the total score of the Quality of Life Index has been associated with significant improvement in overall quality of life, self-image, physical symptoms, and general health in studies assessing change in quality of life.  PHQ-9: Recent Review Flowsheet Data    Depression screen [facility] 2/9 05/13/2020   Decreased Interest 0   Down, Depressed, Hopeless 0   PHQ - 2 Score 0   Altered sleeping 0   Tired, decreased energy 0   Change in appetite 0   Feeling bad or failure about yourself  0   Trouble concentrating 0   Moving slowly or fidgety/restless 0   Suicidal thoughts 0   PHQ-9 Score 0   Difficult doing work/chores Not difficult at all     Interpretation of Total Score  Total Score Depression Severity:  1-4 = Minimal depression, 5-9 = Mild depression, 10-14 = Moderate depression, 15-19 =  Moderately severe depression, 20-27 = Severe depression   Psychosocial Evaluation and Intervention:  Psychosocial Evaluation - 05/07/20 0943      Psychosocial Evaluation & Interventions   Interventions Encouraged to exercise with the program and follow exercise prescription    Comments She can look to her Husband that live at [facility]. Her son lives nearby and she talkes to her daughter in [place]. She has a postive outlook on her health and mental health.    Expected Outcomes Short: Exercise regularly to support mental health and notify staff of any changes. Long: maintain mental health and well being through teaching of rehab or prescribed medications independently.    Continue Psychosocial Services  Follow up required by staff           Psychosocial Re-Evaluation:   Psychosocial Discharge (Final Psychosocial Re-Evaluation):   Education: Education Goals: Education classes will be provided on a weekly basis, covering required topics. Participant will state understanding/return demonstration of topics  presented.  Learning Barriers/Preferences:  Learning Barriers/Preferences - 05/07/20 0940      Learning Barriers/Preferences   Learning Barriers Sight    Learning Preferences None           General Pulmonary Education Topics:  Infection Prevention: - Provides verbal and written material to individual with discussion of infection control including proper hand washing and proper equipment cleaning during exercise session.   Pulmonary Rehab from 05/13/2020 in [facility] Cardiac and Pulmonary Rehab  Date 05/07/20  Educator [facility]  Instruction Review Code 1- Verbalizes Understanding      Falls Prevention: - Provides verbal and written material to individual with discussion of falls prevention and safety.   Pulmonary Rehab from 05/13/2020 in [facility] Cardiac and Pulmonary Rehab  Date 05/07/20  Educator [facility]  Instruction Review Code 1- Verbalizes Understanding      Chronic Lung Diseases: - Group verbal and written instruction to review updates, respiratory medications, advancements in procedures and treatments. Discuss use of supplemental oxygen including available portable oxygen systems, continuous and intermittent flow rates, concentrators, personal use and safety guidelines. Review proper use of inhaler and spacers. Provide informative websites for self-education.    Pulmonary Rehab from 05/13/2020 in [facility] Cardiac and Pulmonary Rehab  Education need identified 05/13/20      Energy Conservation: - Provide group verbal and written instruction for methods to conserve energy, plan and organize activities. Instruct on pacing techniques, use of adaptive equipment and posture/positioning to relieve shortness of breath.   Triggers and Exacerbations: - Group verbal and written instruction to review types of environmental triggers and ways to prevent exacerbations. Discuss weather changes, air quality and the benefits of nasal washing. Review warning signs and symptoms to help prevent infections.  Discuss techniques for effective airway clearance, coughing, and vibrations.   AED/CPR: - Group verbal and written instruction with the use of models to demonstrate the basic use of the AED with the basic ABC's of resuscitation.   Anatomy and Physiology of the Lungs: - Group verbal and written instruction with the use of models to provide basic lung anatomy and physiology related to function, structure and complications of lung disease.   Anatomy & Physiology of the Heart: - Group verbal and written instruction and models provide basic cardiac anatomy and physiology, with the coronary electrical and arterial systems. Review of Valvular disease and Heart Failure   Cardiac Medications: - Group verbal and written instruction to review commonly prescribed medications for heart disease. Reviews the medication, class of the drug, and side  effects.   Other: -Provides group and verbal instruction on various topics (see comments)   Knowledge Questionnaire Score:  Knowledge Questionnaire Score - 05/13/20 1507      Knowledge Questionnaire Score   Pre Score 16/18 Education Focus: O2 safety            Core Components/Risk Factors/Patient Goals at Admission:  Personal Goals and Risk Factors at Admission - 05/13/20 1507      Core Components/Risk Factors/Patient Goals on Admission    Weight Management Yes;Weight Loss    Intervention Weight Management: Develop a combined nutrition and exercise program designed to reach desired caloric intake, while maintaining appropriate intake of nutrient and fiber, sodium and fats, and appropriate energy expenditure required for the weight goal.;Weight Management: Provide education and appropriate resources to help participant work on and attain dietary goals.;Weight Management/Obesity: Establish reasonable short term and long term weight goals.    Admit Weight 138 lb 14.4 oz (63 kg)    Goal Weight: Short Term 133 lb (60.3 kg)    Goal Weight: Long Term 130  lb (59 kg)    Expected Outcomes Short Term: Continue to assess and modify interventions until short term weight is achieved;Long Term: Adherence to nutrition and physical activity/exercise program aimed toward attainment of established weight goal;Weight Loss: Understanding of general recommendations for a balanced deficit meal plan, which promotes 1-2 lb weight loss per week and includes a negative energy balance of [phone] kcal/d;Understanding of distribution of calorie intake throughout the day with the consumption of 4-5 meals/snacks;Understanding recommendations for meals to include 15-35% energy as protein, 25-35% energy from fat, 35-60% energy from carbohydrates, less than 270m of dietary cholesterol, 20-35 gm of total fiber daily    Improve shortness of breath with ADL's Yes    Intervention Provide education, individualized exercise plan and daily activity instruction to help decrease symptoms of SOB with activities of daily living.    Expected Outcomes Short Term: Improve cardiorespiratory fitness to achieve a reduction of symptoms when performing ADLs;Long Term: Be able to perform more ADLs without symptoms or delay the onset of symptoms    Hypertension Yes    Intervention Provide education on lifestyle modifcations including regular physical activity/exercise, weight management, moderate sodium restriction and increased consumption of fresh fruit, vegetables, and low fat dairy, alcohol moderation, and smoking cessation.;Monitor prescription use compliance.    Expected Outcomes Short Term: Continued assessment and intervention until BP is < 140/934mHG in hypertensive participants. < 130/8029mG in hypertensive participants with diabetes, heart failure or chronic kidney disease.;Long Term: Maintenance of blood pressure at goal levels.           Education:Diabetes - Individual verbal and written instruction to review signs/symptoms of diabetes, desired ranges of glucose level fasting, after  meals and with exercise. Acknowledge that pre and post exercise glucose checks will be done for 3 sessions at entry of program.   Education: Know Your Numbers and Risk Factors: -Group verbal and written instruction about important numbers in your health.  Discussion of what are risk factors and how they play a role in the disease process.  Review of Cholesterol, Blood Pressure, Diabetes, and BMI and the role they play in your overall health.   Core Components/Risk Factors/Patient Goals Review:    Core Components/Risk Factors/Patient Goals at Discharge (Final Review):    ITP Comments:  ITP Comments    Row Name 05/07/20 0951 05/13/20 1453         ITP Comments Virtual Visit completed. Patient  informed on EP and RD appointment and 6 Minute walk test. Patient also informed of patient health questionnaires on My Chart. Patient Verbalizes understanding. Visit diagnosis can be found in [facility] 04/11/2020. Completed 6MWT and gym orientation. Initial ITP created and sent for review to Dr. Emily Filbert, Medical Director.             Comments: Initial ITP

## 2020-05-13 NOTE — Patient Instructions (Signed)
Patient Instructions  Patient Details  Name: Shannon Page MRN: [id number] Date of Birth: January 01, 1934 Referring Provider:  Ottie Glazier, MD  Below are your personal goals for exercise, nutrition, and risk factors. Our goal is to help you stay on track towards obtaining and maintaining these goals. We will be discussing your progress on these goals with you throughout the program.  Initial Exercise Prescription:  Initial Exercise Prescription - 05/13/20 1500      Date of Initial Exercise RX and Referring Provider   Date 05/13/20    Referring Provider Lanney Gins, Fraud MD      Treadmill   MPH 1.2    Grade 0    Minutes 15    METs 1.92      Recumbant Bike   Level 1    RPM 50    Watts 10    Minutes 15    METs 1.5      NuStep   Level 1    SPM 80    Minutes 15    METs 1.5      T5 Nustep   Level 1    SPM 80    Minutes 15    METs 1.5      Prescription Details   Frequency (times per week) 3    Duration Progress to 30 minutes of continuous aerobic without signs/symptoms of physical distress      Intensity   THRR 40-80% of Max Heartrate 100-125    Ratings of Perceived Exertion 11-13    Perceived Dyspnea 0-4      Progression   Progression Continue to progress workloads to maintain intensity without signs/symptoms of physical distress.      Resistance Training   Training Prescription Yes    Weight 3 lb    Reps 10-15           Exercise Goals: Frequency: Be able to perform aerobic exercise two to three times per week in program working toward 2-5 days per week of home exercise.  Intensity: Work with a perceived exertion of 11 (fairly light) - 15 (hard) while following your exercise prescription.  We will make changes to your prescription with you as you progress through the program.   Duration: Be able to do 30 to 45 minutes of continuous aerobic exercise in addition to a 5 minute warm-up and a 5 minute cool-down routine.   Nutrition Goals: Your personal  nutrition goals will be established when you do your nutrition analysis with the dietician.  The following are general nutrition guidelines to follow: Cholesterol < 200mg /day Sodium < 1500mg /day Fiber: Women over 50 yrs - 21 grams per day  Personal Goals:  Personal Goals and Risk Factors at Admission - 05/13/20 1507      Core Components/Risk Factors/Patient Goals on Admission    Weight Management Yes;Weight Loss    Intervention Weight Management: Develop a combined nutrition and exercise program designed to reach desired caloric intake, while maintaining appropriate intake of nutrient and fiber, sodium and fats, and appropriate energy expenditure required for the weight goal.;Weight Management: Provide education and appropriate resources to help participant work on and attain dietary goals.;Weight Management/Obesity: Establish reasonable short term and long term weight goals.    Admit Weight 138 lb 14.4 oz (63 kg)    Goal Weight: Short Term 133 lb (60.3 kg)    Goal Weight: Long Term 130 lb (59 kg)    Expected Outcomes Short Term: Continue to assess and modify interventions until short term weight is  achieved;Long Term: Adherence to nutrition and physical activity/exercise program aimed toward attainment of established weight goal;Weight Loss: Understanding of general recommendations for a balanced deficit meal plan, which promotes 1-2 lb weight loss per week and includes a negative energy balance of [phone] kcal/d;Understanding of distribution of calorie intake throughout the day with the consumption of 4-5 meals/snacks;Understanding recommendations for meals to include 15-35% energy as protein, 25-35% energy from fat, 35-60% energy from carbohydrates, less than 200mg  of dietary cholesterol, 20-35 gm of total fiber daily    Improve shortness of breath with ADL's Yes    Intervention Provide education, individualized exercise plan and daily activity instruction to help decrease symptoms of SOB with  activities of daily living.    Expected Outcomes Short Term: Improve cardiorespiratory fitness to achieve a reduction of symptoms when performing ADLs;Long Term: Be able to perform more ADLs without symptoms or delay the onset of symptoms    Hypertension Yes    Intervention Provide education on lifestyle modifcations including regular physical activity/exercise, weight management, moderate sodium restriction and increased consumption of fresh fruit, vegetables, and low fat dairy, alcohol moderation, and smoking cessation.;Monitor prescription use compliance.    Expected Outcomes Short Term: Continued assessment and intervention until BP is < 140/33mm HG in hypertensive participants. < 130/33mm HG in hypertensive participants with diabetes, heart failure or chronic kidney disease.;Long Term: Maintenance of blood pressure at goal levels.           Tobacco Use Initial Evaluation: Social History   Tobacco Use  Smoking Status Former Smoker  . Packs/day: 1.00  . Years: 5.00  . Pack years: 5.00  . Types: Cigarettes  . Quit date: 08/07/1961  . Years since quitting: 58.8  Smokeless Tobacco Never Used    Exercise Goals and Review:  Exercise Goals    Row Name 05/13/20 1505             Exercise Goals   Increase Physical Activity Yes       Intervention Provide advice, education, support and counseling about physical activity/exercise needs.;Develop an individualized exercise prescription for aerobic and resistive training based on initial evaluation findings, risk stratification, comorbidities and participant's personal goals.       Expected Outcomes Short Term: Attend rehab on a regular basis to increase amount of physical activity.;Long Term: Add in home exercise to make exercise part of routine and to increase amount of physical activity.;Long Term: Exercising regularly at least 3-5 days a week.       Increase Strength and Stamina Yes       Intervention Provide advice, education, support  and counseling about physical activity/exercise needs.;Develop an individualized exercise prescription for aerobic and resistive training based on initial evaluation findings, risk stratification, comorbidities and participant's personal goals.       Expected Outcomes Short Term: Increase workloads from initial exercise prescription for resistance, speed, and METs.;Short Term: Perform resistance training exercises routinely during rehab and add in resistance training at home;Long Term: Improve cardiorespiratory fitness, muscular endurance and strength as measured by increased METs and functional capacity (6MWT)       Able to understand and use rate of perceived exertion (RPE) scale Yes       Intervention Provide education and explanation on how to use RPE scale       Expected Outcomes Short Term: Able to use RPE daily in rehab to express subjective intensity level;Long Term:  Able to use RPE to guide intensity level when exercising independently  Able to understand and use Dyspnea scale Yes       Intervention Provide education and explanation on how to use Dyspnea scale       Expected Outcomes Short Term: Able to use Dyspnea scale daily in rehab to express subjective sense of shortness of breath during exertion;Long Term: Able to use Dyspnea scale to guide intensity level when exercising independently       Knowledge and understanding of Target Heart Rate Range (THRR) Yes       Intervention Provide education and explanation of THRR including how the numbers were predicted and where they are located for reference       Expected Outcomes Short Term: Able to state/look up THRR;Short Term: Able to use daily as guideline for intensity in rehab;Long Term: Able to use THRR to govern intensity when exercising independently       Able to check pulse independently Yes       Intervention Provide education and demonstration on how to check pulse in carotid and radial arteries.;Review the importance of being  able to check your own pulse for safety during independent exercise       Expected Outcomes Short Term: Able to explain why pulse checking is important during independent exercise;Long Term: Able to check pulse independently and accurately       Understanding of Exercise Prescription Yes       Intervention Provide education, explanation, and written materials on patient's individual exercise prescription       Expected Outcomes Long Term: Able to explain home exercise prescription to exercise independently;Short Term: Able to explain program exercise prescription              Copy of goals given to participant.

## 2020-05-20 ENCOUNTER — Other Ambulatory Visit: Payer: Self-pay

## 2020-05-20 ENCOUNTER — Encounter: Payer: Medicare Other | Attending: Pulmonary Disease | Admitting: *Deleted

## 2020-05-20 DIAGNOSIS — J984 Other disorders of lung: Secondary | ICD-10-CM | POA: Diagnosis not present

## 2020-05-20 DIAGNOSIS — M419 Scoliosis, unspecified: Secondary | ICD-10-CM | POA: Insufficient documentation

## 2020-05-20 NOTE — Progress Notes (Signed)
Daily Session Note  Patient Details  Name: Shannon Page MRN: [id number] Date of Birth: 02-02-1934 Referring Provider:     Pulmonary Rehab from 05/13/2020 in [facility]  Referring Provider Lanney Gins, Fraud MD      Encounter Date: 05/20/2020  Check In:  Session Check In - 05/20/20 1109      Check-In   Supervising physician immediately available to respond to emergencies See telemetry face sheet for immediately available ER MD    Location [facility]-Cardiac & Pulmonary Rehab    Staff Present Renita Papa, RN BSN;Joseph [address] [place], [place], ACSM CEP, Exercise Physiologist;Kara Eliezer Bottom, MS Exercise Physiologist    Virtual Visit No    Medication changes reported     No    Fall or balance concerns reported    No    Warm-up and Cool-down Performed on first and last piece of equipment    Resistance Training Performed Yes    VAD Patient? No    PAD/SET Patient? No      Pain Assessment   Currently in Pain? No/denies              Social History   Tobacco Use  Smoking Status Former Smoker  . Packs/day: 1.00  . Years: 5.00  . Pack years: 5.00  . Types: Cigarettes  . Quit date: 08/07/1961  . Years since quitting: 58.8  Smokeless Tobacco Never Used    Goals Met:  Independence with exercise equipment Exercise tolerated well No report of cardiac concerns or symptoms Strength training completed today  Goals Unmet:  Not Applicable  Comments: First full day of exercise!  Patient was oriented to gym and equipment including functions, settings, policies, and procedures.  Patient's individual exercise prescription and treatment plan were reviewed.  All starting workloads were established based on the results of the 6 minute walk test done at initial orientation visit.  The plan for exercise progression was also introduced and progression will be customized based on patient's performance and goals.    Dr. Emily Filbert is Medical Director for  [facility].

## 2020-05-22 ENCOUNTER — Other Ambulatory Visit: Payer: Self-pay

## 2020-05-22 ENCOUNTER — Encounter: Payer: Self-pay | Admitting: *Deleted

## 2020-05-22 DIAGNOSIS — J984 Other disorders of lung: Secondary | ICD-10-CM

## 2020-05-22 NOTE — Progress Notes (Signed)
Pulmonary Individual Treatment Plan  Patient Details  Name: SHA BURLING MRN: [id number] Date of Birth: 20-Sep-1933 Referring Provider:     Pulmonary Rehab from 05/13/2020 in [facility] Cardiac and Pulmonary Rehab  Referring Provider Lanney Gins, Fraud MD      Initial Encounter Date:    Pulmonary Rehab from 05/13/2020 in [facility] Cardiac and Pulmonary Rehab  Date 05/13/20      Visit Diagnosis: Restrictive lung disease due to kyphoscoliosis  Patient's Home Medications on Admission:  Current Outpatient Medications:  .  albuterol (VENTOLIN HFA) 108 (90 Base) MCG/ACT inhaler, Inhale into the lungs., Disp: , Rfl:  .  aspirin EC 81 MG tablet, Take by mouth., Disp: , Rfl:  .  Calcium-Vitamin D-Vitamin K (VIACTIV PO), Take by mouth., Disp: , Rfl:  .  furosemide (LASIX) 20 MG tablet, Take 1 tablet (20 mg total) by mouth every other day., Disp: 30 tablet, Rfl: 0 .  furosemide (LASIX) 20 MG tablet, Take by mouth., Disp: , Rfl:  .  hydrocortisone 2.5 % cream, APPLY TO AFFECTED AREA ON THE SKIN TWICE A DAY TO FACE UNTIL CLEAR, THEN AS NEEDED FLARES (Patient not taking: Reported on 05/07/2020), Disp: , Rfl: 1 .  OLANZapine (ZYPREXA) 7.5 MG tablet, Take 7.5 mg by mouth at bedtime., Disp: , Rfl: 3 .  Omega-3 Fatty Acids (FISH OIL PO), Take 500 mg by mouth 2 (two) times daily. , Disp: , Rfl:   Past Medical History: Past Medical History:  Diagnosis Date  . Anxiety     Tobacco Use: Social History   Tobacco Use  Smoking Status Former Smoker  . Packs/day: 1.00  . Years: 5.00  . Pack years: 5.00  . Types: Cigarettes  . Quit date: 08/07/1961  . Years since quitting: 58.8  Smokeless Tobacco Never Used    Labs: Recent Review Flowsheet Data   There is no flowsheet data to display.      Pulmonary Assessment Scores:  Pulmonary Assessment Scores    Row Name 05/13/20 1509         ADL UCSD   ADL Phase Entry     SOB Score total 42     Rest 2     Walk 3     Stairs 4     Bath 0     Dress 0       Shop 2       CAT Score   CAT Score 13       mMRC Score   mMRC Score 1            UCSD: Self-administered rating of dyspnea associated with activities of daily living (ADLs) 6-point scale (0 = "not at all" to 5 = "maximal or unable to do because of breathlessness")  Scoring Scores range from 0 to 120.  Minimally important difference is 5 units  CAT: CAT can identify the health impairment of COPD patients and is better correlated with disease progression.  CAT has a scoring range of zero to 40. The CAT score is classified into four groups of low (less than 10), medium (10 - 20), high (21-30) and very high (31-40) based on the impact level of disease on health status. A CAT score over 10 suggests significant symptoms.  A worsening CAT score could be explained by an exacerbation, poor medication adherence, poor inhaler technique, or progression of COPD or comorbid conditions.  CAT MCID is 2 points  mMRC: mMRC (Modified Medical Research Council) Dyspnea Scale is used to assess the  degree of baseline functional disability in patients of respiratory disease due to dyspnea. No minimal important difference is established. A decrease in score of 1 point or greater is considered a positive change.   Pulmonary Function Assessment:  Pulmonary Function Assessment - 05/07/20 0939      Breath   Shortness of Breath Yes;Limiting activity           Exercise Target Goals: Exercise Program Goal: Individual exercise prescription set using results from initial 6 min walk test and THRR while considering  patient's activity barriers and safety.   Exercise Prescription Goal: Initial exercise prescription builds to 30-45 minutes a day of aerobic activity, 2-3 days per week.  Home exercise guidelines will be given to patient during program as part of exercise prescription that the participant will acknowledge.  Education: Aerobic Exercise & Resistance Training: - Gives group verbal and written  instruction on the various components of exercise. Focuses on aerobic and resistive training programs and the benefits of this training and how to safely progress through these programs..   Education: Exercise & Equipment Safety: - Individual verbal instruction and demonstration of equipment use and safety with use of the equipment.   Pulmonary Rehab from 05/13/2020 in [facility] Cardiac and Pulmonary Rehab  Date 05/07/20  Educator [facility]  Instruction Review Code 1- Verbalizes Understanding      Education: Exercise Physiology & General Exercise Guidelines: - Group verbal and written instruction with models to review the exercise physiology of the cardiovascular system and associated critical values. Provides general exercise guidelines with specific guidelines to those with heart or lung disease.    Education: Flexibility, Balance, Mind/Body Relaxation: Provides group verbal/written instruction on the benefits of flexibility and balance training, including mind/body exercise modes such as yoga, pilates and tai chi.  Demonstration and skill practice provided.   Activity Barriers & Risk Stratification:  Activity Barriers & Cardiac Risk Stratification - 05/13/20 1503      Activity Barriers & Cardiac Risk Stratification   Activity Barriers Deconditioning;Muscular Weakness;Shortness of Breath;Balance Concerns;Assistive Device;Neck/Spine Problems;Back Problems           6 Minute Walk:  6 Minute Walk    Row Name 05/13/20 1453         6 Minute Walk   Phase Initial     Distance 700 feet     Walk Time 6 minutes     # of Rest Breaks 0     MPH 1.33     METS 1.15     RPE 13     Perceived Dyspnea  3     VO2 Peak 4.04     Symptoms Yes (comment)     Comments SOB, shuffles feet, desaturations     Resting HR 76 bpm     Resting BP 136/68     Resting Oxygen Saturation  91 %     Exercise Oxygen Saturation  during 6 min walk 82 %     Max Ex. HR 105 bpm     Max Ex. BP 146/74     2 Minute Post BP  136/74       Interval HR   1 Minute HR 91     2 Minute HR 96     3 Minute HR 99     4 Minute HR 103     5 Minute HR 104     6 Minute HR 105     2 Minute Post HR 94     Interval Heart Rate?  Yes       Interval Oxygen   Interval Oxygen? Yes     Baseline Oxygen Saturation % 91 %     1 Minute Oxygen Saturation % 86 %     1 Minute Liters of Oxygen 0 L  Room Air     2 Minute Oxygen Saturation % 84 %     2 Minute Liters of Oxygen 0 L     3 Minute Oxygen Saturation % 83 %     3 Minute Liters of Oxygen 0 L     4 Minute Oxygen Saturation % 83 %     4 Minute Liters of Oxygen 0 L     5 Minute Oxygen Saturation % 82 %     5 Minute Liters of Oxygen 0 L     6 Minute Oxygen Saturation % 82 %     6 Minute Liters of Oxygen 0 L     2 Minute Post Oxygen Saturation % 86 %     2 Minute Post Liters of Oxygen 0 L           Oxygen Initial Assessment:  Oxygen Initial Assessment - 05/07/20 0938      Home Oxygen   Home Oxygen Device None    Sleep Oxygen Prescription None    Home Exercise Oxygen Prescription None    Home Resting Oxygen Prescription None    Compliance with Home Oxygen Use Yes      Initial 6 min Walk   Oxygen Used None      Program Oxygen Prescription   Program Oxygen Prescription None      Intervention   Short Term Goals To learn and demonstrate proper use of respiratory medications;To learn and demonstrate proper pursed lip breathing techniques or other breathing techniques.;To learn and understand importance of maintaining oxygen saturations>88%;To learn and understand importance of monitoring SPO2 with pulse oximeter and demonstrate accurate use of the pulse oximeter.    Long  Term Goals Maintenance of O2 saturations>88%;Compliance with respiratory medication;Demonstrates proper use of MDI's;Exhibits proper breathing techniques, such as pursed lip breathing or other method taught during program session;Verbalizes importance of monitoring SPO2 with pulse oximeter and return  demonstration           Oxygen Re-Evaluation:  Oxygen Re-Evaluation    Row Name 05/20/20 1111             Program Oxygen Prescription   Program Oxygen Prescription None         Home Oxygen   Home Oxygen Device None       Sleep Oxygen Prescription None       Home Exercise Oxygen Prescription None       Home Resting Oxygen Prescription None       Compliance with Home Oxygen Use Yes         Goals/Expected Outcomes   Short Term Goals To learn and demonstrate proper use of respiratory medications;To learn and demonstrate proper pursed lip breathing techniques or other breathing techniques.;To learn and understand importance of maintaining oxygen saturations>88%;To learn and understand importance of monitoring SPO2 with pulse oximeter and demonstrate accurate use of the pulse oximeter.       Long  Term Goals Maintenance of O2 saturations>88%;Compliance with respiratory medication;Demonstrates proper use of MDI's;Exhibits proper breathing techniques, such as pursed lip breathing or other method taught during program session;Verbalizes importance of monitoring SPO2 with pulse oximeter and return demonstration       Comments Reviewed PLB technique with  pt.  Talked about how it works and it's importance in maintaining their exercise saturations.       Goals/Expected Outcomes Short: Become more profiecient at using PLB.   Long: Become independent at using PLB.              Oxygen Discharge (Final Oxygen Re-Evaluation):  Oxygen Re-Evaluation - 05/20/20 1111      Program Oxygen Prescription   Program Oxygen Prescription None      Home Oxygen   Home Oxygen Device None    Sleep Oxygen Prescription None    Home Exercise Oxygen Prescription None    Home Resting Oxygen Prescription None    Compliance with Home Oxygen Use Yes      Goals/Expected Outcomes   Short Term Goals To learn and demonstrate proper use of respiratory medications;To learn and demonstrate proper pursed lip breathing  techniques or other breathing techniques.;To learn and understand importance of maintaining oxygen saturations>88%;To learn and understand importance of monitoring SPO2 with pulse oximeter and demonstrate accurate use of the pulse oximeter.    Long  Term Goals Maintenance of O2 saturations>88%;Compliance with respiratory medication;Demonstrates proper use of MDI's;Exhibits proper breathing techniques, such as pursed lip breathing or other method taught during program session;Verbalizes importance of monitoring SPO2 with pulse oximeter and return demonstration    Comments Reviewed PLB technique with pt.  Talked about how it works and it's importance in maintaining their exercise saturations.    Goals/Expected Outcomes Short: Become more profiecient at using PLB.   Long: Become independent at using PLB.           Initial Exercise Prescription:  Initial Exercise Prescription - 05/13/20 1500      Date of Initial Exercise RX and Referring Provider   Date 05/13/20    Referring Provider Lanney Gins, Fraud MD      Treadmill   MPH 1.2    Grade 0    Minutes 15    METs 1.92      Recumbant Bike   Level 1    RPM 50    Watts 10    Minutes 15    METs 1.5      NuStep   Level 1    SPM 80    Minutes 15    METs 1.5      T5 Nustep   Level 1    SPM 80    Minutes 15    METs 1.5      Prescription Details   Frequency (times per week) 3    Duration Progress to 30 minutes of continuous aerobic without signs/symptoms of physical distress      Intensity   THRR 40-80% of Max Heartrate 100-125    Ratings of Perceived Exertion 11-13    Perceived Dyspnea 0-4      Progression   Progression Continue to progress workloads to maintain intensity without signs/symptoms of physical distress.      Resistance Training   Training Prescription Yes    Weight 3 lb    Reps 10-15           Perform Capillary Blood Glucose checks as needed.  Exercise Prescription Changes:  Exercise Prescription  Changes    Row Name 05/13/20 1500             Response to Exercise   Blood Pressure (Admit) 136/68       Blood Pressure (Exercise) 146/74       Blood Pressure (Exit) 128/60  Heart Rate (Admit) 76 bpm       Heart Rate (Exercise) 105 bpm       Heart Rate (Exit) 90 bpm       Oxygen Saturation (Admit) 91 %       Oxygen Saturation (Exercise) 82 %       Oxygen Saturation (Exit) 90 %       Rating of Perceived Exertion (Exercise) 13       Perceived Dyspnea (Exercise) 3       Symptoms SOB       Comments walk test results              Exercise Comments:   Exercise Goals and Review:  Exercise Goals    Row Name 05/13/20 1505             Exercise Goals   Increase Physical Activity Yes       Intervention Provide advice, education, support and counseling about physical activity/exercise needs.;Develop an individualized exercise prescription for aerobic and resistive training based on initial evaluation findings, risk stratification, comorbidities and participant's personal goals.       Expected Outcomes Short Term: Attend rehab on a regular basis to increase amount of physical activity.;Long Term: Add in home exercise to make exercise part of routine and to increase amount of physical activity.;Long Term: Exercising regularly at least 3-5 days a week.       Increase Strength and Stamina Yes       Intervention Provide advice, education, support and counseling about physical activity/exercise needs.;Develop an individualized exercise prescription for aerobic and resistive training based on initial evaluation findings, risk stratification, comorbidities and participant's personal goals.       Expected Outcomes Short Term: Increase workloads from initial exercise prescription for resistance, speed, and METs.;Short Term: Perform resistance training exercises routinely during rehab and add in resistance training at home;Long Term: Improve cardiorespiratory fitness, muscular endurance and  strength as measured by increased METs and functional capacity (6MWT)       Able to understand and use rate of perceived exertion (RPE) scale Yes       Intervention Provide education and explanation on how to use RPE scale       Expected Outcomes Short Term: Able to use RPE daily in rehab to express subjective intensity level;Long Term:  Able to use RPE to guide intensity level when exercising independently       Able to understand and use Dyspnea scale Yes       Intervention Provide education and explanation on how to use Dyspnea scale       Expected Outcomes Short Term: Able to use Dyspnea scale daily in rehab to express subjective sense of shortness of breath during exertion;Long Term: Able to use Dyspnea scale to guide intensity level when exercising independently       Knowledge and understanding of Target Heart Rate Range (THRR) Yes       Intervention Provide education and explanation of THRR including how the numbers were predicted and where they are located for reference       Expected Outcomes Short Term: Able to state/look up THRR;Short Term: Able to use daily as guideline for intensity in rehab;Long Term: Able to use THRR to govern intensity when exercising independently       Able to check pulse independently Yes       Intervention Provide education and demonstration on how to check pulse in carotid and radial arteries.;Review the importance of being able  to check your own pulse for safety during independent exercise       Expected Outcomes Short Term: Able to explain why pulse checking is important during independent exercise;Long Term: Able to check pulse independently and accurately       Understanding of Exercise Prescription Yes       Intervention Provide education, explanation, and written materials on patient's individual exercise prescription       Expected Outcomes Long Term: Able to explain home exercise prescription to exercise independently;Short Term: Able to explain program  exercise prescription              Exercise Goals Re-Evaluation :  Exercise Goals Re-Evaluation    Row Name 05/20/20 1110             Exercise Goal Re-Evaluation   Exercise Goals Review Increase Physical Activity;Able to understand and use rate of perceived exertion (RPE) scale;Knowledge and understanding of Target Heart Rate Range (THRR);Understanding of Exercise Prescription;Increase Strength and Stamina;Able to check pulse independently;Able to understand and use Dyspnea scale       Comments Reviewed RPE and dyspnea scales, THR and program prescription with pt today.  Pt voiced understanding and was given a copy of goals to take home.       Expected Outcomes Short: Use RPE daily to regulate intensity. Long: Follow program prescription in THR.              Discharge Exercise Prescription (Final Exercise Prescription Changes):  Exercise Prescription Changes - 05/13/20 1500      Response to Exercise   Blood Pressure (Admit) 136/68    Blood Pressure (Exercise) 146/74    Blood Pressure (Exit) 128/60    Heart Rate (Admit) 76 bpm    Heart Rate (Exercise) 105 bpm    Heart Rate (Exit) 90 bpm    Oxygen Saturation (Admit) 91 %    Oxygen Saturation (Exercise) 82 %    Oxygen Saturation (Exit) 90 %    Rating of Perceived Exertion (Exercise) 13    Perceived Dyspnea (Exercise) 3    Symptoms SOB    Comments walk test results           Nutrition:  Target Goals: Understanding of nutrition guidelines, daily intake of sodium <157m, cholesterol <2040m calories 30% from fat and 7% or less from saturated fats, daily to have 5 or more servings of fruits and vegetables.  Education: Controlling Sodium/Reading Food Labels -Group verbal and written material supporting the discussion of sodium use in heart healthy nutrition. Review and explanation with models, verbal and written materials for utilization of the food label.   Education: General Nutrition Guidelines/Fats and Fiber: -Group  instruction provided by verbal, written material, models and posters to present the general guidelines for heart healthy nutrition. Gives an explanation and review of dietary fats and fiber.   Biometrics:  Pre Biometrics - 05/13/20 1507      Pre Biometrics   Height 4' 11.5" (1.511 m)    Weight 138 lb 14.4 oz (63 kg)    BMI (Calculated) 27.6    Single Leg Stand 1.6 seconds            Nutrition Therapy Plan and Nutrition Goals:   Nutrition Assessments:  Nutrition Assessments - 05/13/20 1507      MEDFICTS Scores   Pre Score 99           MEDIFICTS Score Key:          ?70 Need to make dietary changes  40-70 Heart Healthy Diet         ? 40 Therapeutic Level Cholesterol Diet  Nutrition Goals Re-Evaluation:   Nutrition Goals Discharge (Final Nutrition Goals Re-Evaluation):   Psychosocial: Target Goals: Acknowledge presence or absence of significant depression and/or stress, maximize coping skills, provide positive support system. Participant is able to verbalize types and ability to use techniques and skills needed for reducing stress and depression.   Education: Depression - Provides group verbal and written instruction on the correlation between heart/lung disease and depressed mood, treatment options, and the stigmas associated with seeking treatment.   Education: Sleep Hygiene -Provides group verbal and written instruction about how sleep can affect your health.  Define sleep hygiene, discuss sleep cycles and impact of sleep habits. Review good sleep hygiene tips.    Education: Stress and Anxiety: - Provides group verbal and written instruction about the health risks of elevated stress and causes of high stress.  Discuss the correlation between heart/lung disease and anxiety and treatment options. Review healthy ways to manage with stress and anxiety.   Initial Review & Psychosocial Screening:  Initial Psych Review & Screening - 05/07/20 0940       Initial Review   Current issues with Current Psychotropic Meds      Family Dynamics   Good Support System? Yes    Comments She can look to her Husband that live at [facility]. Her son lives nearby and she talkes to her daughter in [place]. She has a postive outlook on her health and mental health.      Barriers   Psychosocial barriers to participate in program There are no identifiable barriers or psychosocial needs.;The patient should benefit from training in stress management and relaxation.      Screening Interventions   Interventions To provide support and resources with identified psychosocial needs;Encouraged to exercise;Provide feedback about the scores to participant    Expected Outcomes Short Term goal: Utilizing psychosocial counselor, staff and physician to assist with identification of specific Stressors or current issues interfering with healing process. Setting desired goal for each stressor or current issue identified.;Long Term Goal: Stressors or current issues are controlled or eliminated.;Long Term goal: The participant improves quality of Life and PHQ9 Scores as seen by post scores and/or verbalization of changes;Short Term goal: Identification and review with participant of any Quality of Life or Depression concerns found by scoring the questionnaire.           Quality of Life Scores:  Scores of 19 and below usually indicate a poorer quality of life in these areas.  A difference of  2-3 points is a clinically meaningful difference.  A difference of 2-3 points in the total score of the Quality of Life Index has been associated with significant improvement in overall quality of life, self-image, physical symptoms, and general health in studies assessing change in quality of life.  PHQ-9: Recent Review Flowsheet Data    Depression screen [facility] 2/9 05/13/2020   Decreased Interest 0   Down, Depressed, Hopeless 0   PHQ - 2 Score 0   Altered sleeping 0   Tired, decreased  energy 0   Change in appetite 0   Feeling bad or failure about yourself  0   Trouble concentrating 0   Moving slowly or fidgety/restless 0   Suicidal thoughts 0   PHQ-9 Score 0   Difficult doing work/chores Not difficult at all     Interpretation of Total Score  Total Score Depression Severity:  1-4 = Minimal depression, 5-9 = Mild depression, 10-14 = Moderate depression, 15-19 = Moderately severe depression, 20-27 = Severe depression   Psychosocial Evaluation and Intervention:  Psychosocial Evaluation - 05/07/20 0943      Psychosocial Evaluation & Interventions   Interventions Encouraged to exercise with the program and follow exercise prescription    Comments She can look to her Husband that live at [facility]. Her son lives nearby and she talkes to her daughter in [place]. She has a postive outlook on her health and mental health.    Expected Outcomes Short: Exercise regularly to support mental health and notify staff of any changes. Long: maintain mental health and well being through teaching of rehab or prescribed medications independently.    Continue Psychosocial Services  Follow up required by staff           Psychosocial Re-Evaluation:   Psychosocial Discharge (Final Psychosocial Re-Evaluation):   Education: Education Goals: Education classes will be provided on a weekly basis, covering required topics. Participant will state understanding/return demonstration of topics presented.  Learning Barriers/Preferences:  Learning Barriers/Preferences - 05/07/20 0940      Learning Barriers/Preferences   Learning Barriers Sight    Learning Preferences None           General Pulmonary Education Topics:  Infection Prevention: - Provides verbal and written material to individual with discussion of infection control including proper hand washing and proper equipment cleaning during exercise session.   Pulmonary Rehab from 05/13/2020 in [facility] Cardiac and Pulmonary  Rehab  Date 05/07/20  Educator [facility]  Instruction Review Code 1- Verbalizes Understanding      Falls Prevention: - Provides verbal and written material to individual with discussion of falls prevention and safety.   Pulmonary Rehab from 05/13/2020 in [facility] Cardiac and Pulmonary Rehab  Date 05/07/20  Educator [facility]  Instruction Review Code 1- Verbalizes Understanding      Chronic Lung Diseases: - Group verbal and written instruction to review updates, respiratory medications, advancements in procedures and treatments. Discuss use of supplemental oxygen including available portable oxygen systems, continuous and intermittent flow rates, concentrators, personal use and safety guidelines. Review proper use of inhaler and spacers. Provide informative websites for self-education.    Pulmonary Rehab from 05/13/2020 in [facility] Cardiac and Pulmonary Rehab  Education need identified 05/13/20      Energy Conservation: - Provide group verbal and written instruction for methods to conserve energy, plan and organize activities. Instruct on pacing techniques, use of adaptive equipment and posture/positioning to relieve shortness of breath.   Triggers and Exacerbations: - Group verbal and written instruction to review types of environmental triggers and ways to prevent exacerbations. Discuss weather changes, air quality and the benefits of nasal washing. Review warning signs and symptoms to help prevent infections. Discuss techniques for effective airway clearance, coughing, and vibrations.   AED/CPR: - Group verbal and written instruction with the use of models to demonstrate the basic use of the AED with the basic ABC's of resuscitation.   Anatomy and Physiology of the Lungs: - Group verbal and written instruction with the use of models to provide basic lung anatomy and physiology related to function, structure and complications of lung disease.   Anatomy & Physiology of the Heart: - Group verbal and  written instruction and models provide basic cardiac anatomy and physiology, with the coronary electrical and arterial systems. Review of Valvular disease and Heart Failure   Cardiac Medications: - Group verbal and written instruction to review commonly  prescribed medications for heart disease. Reviews the medication, class of the drug, and side effects.   Other: -Provides group and verbal instruction on various topics (see comments)   Knowledge Questionnaire Score:  Knowledge Questionnaire Score - 05/13/20 1507      Knowledge Questionnaire Score   Pre Score 16/18 Education Focus: O2 safety            Core Components/Risk Factors/Patient Goals at Admission:  Personal Goals and Risk Factors at Admission - 05/13/20 1507      Core Components/Risk Factors/Patient Goals on Admission    Weight Management Yes;Weight Loss    Intervention Weight Management: Develop a combined nutrition and exercise program designed to reach desired caloric intake, while maintaining appropriate intake of nutrient and fiber, sodium and fats, and appropriate energy expenditure required for the weight goal.;Weight Management: Provide education and appropriate resources to help participant work on and attain dietary goals.;Weight Management/Obesity: Establish reasonable short term and long term weight goals.    Admit Weight 138 lb 14.4 oz (63 kg)    Goal Weight: Short Term 133 lb (60.3 kg)    Goal Weight: Long Term 130 lb (59 kg)    Expected Outcomes Short Term: Continue to assess and modify interventions until short term weight is achieved;Long Term: Adherence to nutrition and physical activity/exercise program aimed toward attainment of established weight goal;Weight Loss: Understanding of general recommendations for a balanced deficit meal plan, which promotes 1-2 lb weight loss per week and includes a negative energy balance of [phone] kcal/d;Understanding of distribution of calorie intake throughout the day  with the consumption of 4-5 meals/snacks;Understanding recommendations for meals to include 15-35% energy as protein, 25-35% energy from fat, 35-60% energy from carbohydrates, less than 225m of dietary cholesterol, 20-35 gm of total fiber daily    Improve shortness of breath with ADL's Yes    Intervention Provide education, individualized exercise plan and daily activity instruction to help decrease symptoms of SOB with activities of daily living.    Expected Outcomes Short Term: Improve cardiorespiratory fitness to achieve a reduction of symptoms when performing ADLs;Long Term: Be able to perform more ADLs without symptoms or delay the onset of symptoms    Hypertension Yes    Intervention Provide education on lifestyle modifcations including regular physical activity/exercise, weight management, moderate sodium restriction and increased consumption of fresh fruit, vegetables, and low fat dairy, alcohol moderation, and smoking cessation.;Monitor prescription use compliance.    Expected Outcomes Short Term: Continued assessment and intervention until BP is < 140/933mHG in hypertensive participants. < 130/8049mG in hypertensive participants with diabetes, heart failure or chronic kidney disease.;Long Term: Maintenance of blood pressure at goal levels.           Education:Diabetes - Individual verbal and written instruction to review signs/symptoms of diabetes, desired ranges of glucose level fasting, after meals and with exercise. Acknowledge that pre and post exercise glucose checks will be done for 3 sessions at entry of program.   Education: Know Your Numbers and Risk Factors: -Group verbal and written instruction about important numbers in your health.  Discussion of what are risk factors and how they play a role in the disease process.  Review of Cholesterol, Blood Pressure, Diabetes, and BMI and the role they play in your overall health.   Core Components/Risk Factors/Patient Goals Review:     Core Components/Risk Factors/Patient Goals at Discharge (Final Review):    ITP Comments:  ITP Comments    Row Name 05/07/20 0951 05/13/20 1453  05/20/20 1110 05/22/20 0727     ITP Comments Virtual Visit completed. Patient informed on EP and RD appointment and 6 Minute walk test. Patient also informed of patient health questionnaires on My Chart. Patient Verbalizes understanding. Visit diagnosis can be found in [facility] 04/11/2020. Completed 6MWT and gym orientation. Initial ITP created and sent for review to Dr. Emily Filbert, Medical Director. First full day of exercise!  Patient was oriented to gym and equipment including functions, settings, policies, and procedures.  Patient's individual exercise prescription and treatment plan were reviewed.  All starting workloads were established based on the results of the 6 minute walk test done at initial orientation visit.  The plan for exercise progression was also introduced and progression will be customized based on patient's performance and goals. 30 Day review completed. Medical Director ITP review done, changes made as directed, and signed approval by Medical Director.           Comments:

## 2020-05-22 NOTE — Progress Notes (Signed)
Daily Session Note  Patient Details  Name: Shannon Page MRN: [id number] Date of Birth: [birth year] Referring Provider:     Pulmonary Rehab from 05/13/2020 in [facility] Cardiac and Pulmonary Rehab  Referring Provider Lanney Gins, Fraud MD      Encounter Date: 05/22/2020  Check In:  Session Check In - 05/22/20 1123      Check-In   Supervising physician immediately available to respond to emergencies See telemetry face sheet for immediately available ER MD    Location [facility]-Cardiac & Pulmonary Rehab    Staff Present Birdie Sons, MPA, RN;Joseph Darrin Nipper, [place], RCEP, CCRP, CCET    Virtual Visit No    Medication changes reported     No    Fall or balance concerns reported    No    Warm-up and Cool-down Performed on first and last piece of equipment    Resistance Training Performed Yes    VAD Patient? No    PAD/SET Patient? No      Pain Assessment   Currently in Pain? No/denies              Social History   Tobacco Use  Smoking Status Former Smoker  . Packs/day: 1.00  . Years: 5.00  . Pack years: 5.00  . Types: Cigarettes  . Quit date: 08/07/1961  . Years since quitting: 58.8  Smokeless Tobacco Never Used    Goals Met:  Independence with exercise equipment Exercise tolerated well No report of cardiac concerns or symptoms Strength training completed today  Goals Unmet:  Not Applicable  Comments: Pt able to follow exercise prescription today without complaint.  Will continue to monitor for progression.    Dr. Emily Filbert is Medical Director for [place] and [facility].

## 2020-05-24 ENCOUNTER — Encounter: Payer: Medicare Other | Admitting: *Deleted

## 2020-05-24 ENCOUNTER — Other Ambulatory Visit: Payer: Self-pay

## 2020-05-24 DIAGNOSIS — J984 Other disorders of lung: Secondary | ICD-10-CM | POA: Diagnosis not present

## 2020-05-24 NOTE — Progress Notes (Signed)
Daily Session Note  Patient Details  Name: Shannon Page MRN: [id number] Date of Birth: April 15, 1934 Referring Provider:     Pulmonary Rehab from 05/13/2020 in [facility] Cardiac and Pulmonary Rehab  Referring Provider Lanney Gins, Fraud MD      Encounter Date: 05/24/2020  Check In:  Session Check In - 05/24/20 1122      Check-In   Supervising physician immediately available to respond to emergencies See telemetry face sheet for immediately available ER MD    Location [facility]-Cardiac & Pulmonary Rehab    Staff Present Renita Papa, RN BSN;Joseph [address] [place], [place], [place], CCRP, CCET    Virtual Visit No    Medication changes reported     No    Fall or balance concerns reported    No    Warm-up and Cool-down Performed on first and last piece of equipment    Resistance Training Performed Yes    VAD Patient? No    PAD/SET Patient? No      Pain Assessment   Currently in Pain? No/denies              Social History   Tobacco Use  Smoking Status Former Smoker  . Packs/day: 1.00  . Years: 5.00  . Pack years: 5.00  . Types: Cigarettes  . Quit date: 08/07/1961  . Years since quitting: 58.8  Smokeless Tobacco Never Used    Goals Met:  Independence with exercise equipment Exercise tolerated well No report of cardiac concerns or symptoms Strength training completed today  Goals Unmet:  Not Applicable  Comments: Pt able to follow exercise prescription today without complaint.  Will continue to monitor for progression.    Dr. Emily Filbert is Medical Director for [place] and [facility].

## 2020-05-27 ENCOUNTER — Other Ambulatory Visit: Payer: Self-pay

## 2020-05-27 ENCOUNTER — Encounter: Payer: Medicare Other | Admitting: *Deleted

## 2020-05-27 ENCOUNTER — Encounter: Payer: Self-pay | Admitting: *Deleted

## 2020-05-27 DIAGNOSIS — J984 Other disorders of lung: Secondary | ICD-10-CM | POA: Diagnosis not present

## 2020-05-27 DIAGNOSIS — M419 Scoliosis, unspecified: Secondary | ICD-10-CM

## 2020-05-27 NOTE — Progress Notes (Signed)
Daily Session Note  Patient Details  Name: MARAL LAMPE MRN: [id number] Date of Birth: 27-Jun-1934 Referring Provider:     Pulmonary Rehab from 05/13/2020 in [facility] Cardiac and Pulmonary Rehab  Referring Provider Lanney Gins, Fraud MD      Encounter Date: 05/27/2020  Check In:  Session Check In - 05/27/20 1111      Check-In   Supervising physician immediately available to respond to emergencies See telemetry face sheet for immediately available ER MD    Location [facility]-Cardiac & Pulmonary Rehab    Staff Present Renita Papa, RN Margurite Auerbach, MS Exercise Physiologist;Kelly Amedeo Plenty, BS, ACSM CEP, Exercise Physiologist    Virtual Visit No    Medication changes reported     No    Fall or balance concerns reported    No    Warm-up and Cool-down Performed on first and last piece of equipment    Resistance Training Performed Yes    VAD Patient? No    PAD/SET Patient? No      Pain Assessment   Currently in Pain? No/denies              Social History   Tobacco Use  Smoking Status Former Smoker  . Packs/day: 1.00  . Years: 5.00  . Pack years: 5.00  . Types: Cigarettes  . Quit date: 08/07/1961  . Years since quitting: 58.8  Smokeless Tobacco Never Used    Goals Met:  Independence with exercise equipment Exercise tolerated well No report of cardiac concerns or symptoms Strength training completed today  Goals Unmet:  Not Applicable  Comments: Pt able to follow exercise prescription today without complaint.  Will continue to monitor for progression.    Dr. Emily Filbert is Medical Director for [place] and [facility].

## 2020-05-29 ENCOUNTER — Telehealth: Payer: Self-pay | Admitting: *Deleted

## 2020-05-29 ENCOUNTER — Encounter: Payer: Self-pay | Admitting: *Deleted

## 2020-05-29 NOTE — Telephone Encounter (Signed)
-----   Message from Ottie Glazier, MD sent at 05/28/2020 10:19 AM EST ----- Regarding: RE: oxygen requirement Contact: [phone] Thank you Ailene Ravel for working with [facility] and appreciate the update.  I would be ok with her SpO2 to be >80% with exertion.   Thank you ,   -Dr A   ----- Message ----- From: Irving Shows, RN Sent: 05/27/2020  11:37 AM EST To: Ottie Glazier, MD Subject: oxygen requirement                             Dr. Lanney Gins,     Sruti has been in Pulmonary Rehab for 4 sessions now and each time she has dipped into mid 80s. At rest she's 88-90%. During exercise most of the time she will drop and we've had her stop and rest, but she keeps going down below our program requirement of 88%. She does not feel SOB or complain of any discomfort, heart rate stable. She is doing pursed lip breathing, which only helps for a brief moment. In order for her to be able to progress and consistently exercise we would appreciate an order for her for O2 and guidelines for what range you want her oxygen to stay at specifically.    Thank you,  Renita Papa RN, BSN [facility]

## 2020-06-03 ENCOUNTER — Encounter: Payer: Self-pay | Admitting: *Deleted

## 2020-06-03 ENCOUNTER — Telehealth: Payer: Self-pay | Admitting: *Deleted

## 2020-06-03 DIAGNOSIS — M419 Scoliosis, unspecified: Secondary | ICD-10-CM

## 2020-06-03 DIAGNOSIS — J984 Other disorders of lung: Secondary | ICD-10-CM

## 2020-06-03 NOTE — Telephone Encounter (Signed)
Sherra called out again.  Had a negative COVID test.  Just got meds for cold today and hopes to return this week.

## 2020-06-10 ENCOUNTER — Ambulatory Visit: Payer: Medicare Other

## 2020-06-19 ENCOUNTER — Encounter: Payer: Self-pay | Admitting: *Deleted

## 2020-06-19 DIAGNOSIS — M419 Scoliosis, unspecified: Secondary | ICD-10-CM

## 2020-06-19 DIAGNOSIS — J984 Other disorders of lung: Secondary | ICD-10-CM

## 2020-06-19 NOTE — Progress Notes (Signed)
Discharge Progress Report  Patient Details  Name: Shannon Page MRN: [id number] Date of Birth: [birth year] Referring Provider:     Pulmonary Rehab from 05/13/2020 in [facility] Cardiac and Pulmonary Rehab  Referring Provider Lanney Gins, Fraud MD       Number of Visits: 5  Reason for Discharge:  Early Exit:  Personal  Smoking History:  Social History   Tobacco Use  Smoking Status Former Smoker   Packs/day: 1.00   Years: 5.00   Pack years: 5.00   Types: Cigarettes   Quit date: 08/07/1961   Years since quitting: 58.9  Smokeless Tobacco Never Used    Diagnosis:  No diagnosis found.  ADL UCSD:  Pulmonary Assessment Scores    Row Name 05/13/20 1509         ADL UCSD   ADL Phase Entry     SOB Score total 42     Rest 2     Walk 3     Stairs 4     Bath 0     Dress 0     Shop 2       CAT Score   CAT Score 13       mMRC Score   mMRC Score 1            Initial Exercise Prescription:  Initial Exercise Prescription - 05/13/20 1500      Date of Initial Exercise RX and Referring Provider   Date 05/13/20    Referring Provider Lanney Gins, Fraud MD      Treadmill   MPH 1.2    Grade 0    Minutes 15    METs 1.92      Recumbant Bike   Level 1    RPM 50    Watts 10    Minutes 15    METs 1.5      NuStep   Level 1    SPM 80    Minutes 15    METs 1.5      T5 Nustep   Level 1    SPM 80    Minutes 15    METs 1.5      Prescription Details   Frequency (times per week) 3    Duration Progress to 30 minutes of continuous aerobic without signs/symptoms of physical distress      Intensity   THRR 40-80% of Max Heartrate 100-125    Ratings of Perceived Exertion 11-13    Perceived Dyspnea 0-4      Progression   Progression Continue to progress workloads to maintain intensity without signs/symptoms of physical distress.      Resistance Training   Training Prescription Yes    Weight 3 lb    Reps 10-15           Discharge Exercise Prescription (Final  Exercise Prescription Changes):  Exercise Prescription Changes - 05/27/20 1300      Response to Exercise   Blood Pressure (Admit) 104/60    Blood Pressure (Exercise) 128/70    Blood Pressure (Exit) 106/66    Heart Rate (Admit) 75 bpm    Heart Rate (Exercise) 103 bpm    Heart Rate (Exit) 95 bpm    Oxygen Saturation (Admit) 93 %    Oxygen Saturation (Exercise) 89 %    Oxygen Saturation (Exit) 90 %    Rating of Perceived Exertion (Exercise) 12    Perceived Dyspnea (Exercise) 1    Duration Progress to 30 minutes of  aerobic without signs/symptoms of physical  distress    Intensity THRR unchanged      Progression   Progression Continue to progress workloads to maintain intensity without signs/symptoms of physical distress.    Average METs 1.3      Resistance Training   Training Prescription Yes    Weight 3 lb    Reps 10-15      Treadmill   MPH 1.2    Grade 0    Minutes 15    METs 1.92      REL-XR   Level 1    Minutes 15           Functional Capacity:  6 Minute Walk    Row Name 05/13/20 1453         6 Minute Walk   Phase Initial     Distance 700 feet     Walk Time 6 minutes     # of Rest Breaks 0     MPH 1.33     METS 1.15     RPE 13     Perceived Dyspnea  3     VO2 Peak 4.04     Symptoms Yes (comment)     Comments SOB, shuffles feet, desaturations     Resting HR 76 bpm     Resting BP 136/68     Resting Oxygen Saturation  91 %     Exercise Oxygen Saturation  during 6 min walk 82 %     Max Ex. HR 105 bpm     Max Ex. BP 146/74     2 Minute Post BP 136/74       Interval HR   1 Minute HR 91     2 Minute HR 96     3 Minute HR 99     4 Minute HR 103     5 Minute HR 104     6 Minute HR 105     2 Minute Post HR 94     Interval Heart Rate? Yes       Interval Oxygen   Interval Oxygen? Yes     Baseline Oxygen Saturation % 91 %     1 Minute Oxygen Saturation % 86 %     1 Minute Liters of Oxygen 0 L  Room Air     2 Minute Oxygen Saturation % 84 %     2  Minute Liters of Oxygen 0 L     3 Minute Oxygen Saturation % 83 %     3 Minute Liters of Oxygen 0 L     4 Minute Oxygen Saturation % 83 %     4 Minute Liters of Oxygen 0 L     5 Minute Oxygen Saturation % 82 %     5 Minute Liters of Oxygen 0 L     6 Minute Oxygen Saturation % 82 %     6 Minute Liters of Oxygen 0 L     2 Minute Post Oxygen Saturation % 86 %     2 Minute Post Liters of Oxygen 0 L            Psychological, QOL, Others - Outcomes: PHQ 2/9: Depression screen PHQ 2/9 05/13/2020  Decreased Interest 0  Down, Depressed, Hopeless 0  PHQ - 2 Score 0  Altered sleeping 0  Tired, decreased energy 0  Change in appetite 0  Feeling bad or failure about yourself  0  Trouble concentrating 0  Moving slowly or fidgety/restless 0  Suicidal  thoughts 0  PHQ-9 Score 0  Difficult doing work/chores Not difficult at all    Quality of Life:  Nutrition & Weight - Outcomes:  Pre Biometrics - 05/13/20 1507      Pre Biometrics   Height 4' 11.5" (1.511 m)    Weight 138 lb 14.4 oz (63 kg)    BMI (Calculated) 27.6    Single Leg Stand 1.6 seconds            Nutrition:   Nutrition Discharge:  Nutrition Assessments - 05/13/20 1507      MEDFICTS Scores   Pre Score 99           Education Questionnaire Score:  Knowledge Questionnaire Score - 05/13/20 1507      Knowledge Questionnaire Score   Pre Score 16/18 Education Focus: O2 safety           Goals reviewed with patient; copy given to patient.

## 2020-06-19 NOTE — Progress Notes (Signed)
Pulmonary Individual Treatment Plan  Patient Details  Name: Shannon Page MRN: [id number] Date of Birth: 08-06-1933 Referring Provider:     Pulmonary Rehab from 05/13/2020 in [facility] Cardiac and Pulmonary Rehab  Referring Provider Lanney Gins, Fraud MD      Initial Encounter Date:    Pulmonary Rehab from 05/13/2020 in [facility] Cardiac and Pulmonary Rehab  Date 05/13/20      Visit Diagnosis: Restrictive lung disease due to kyphoscoliosis  Patient's Home Medications on Admission:  Current Outpatient Medications:  .  albuterol (VENTOLIN HFA) 108 (90 Base) MCG/ACT inhaler, Inhale into the lungs., Disp: , Rfl:  .  aspirin EC 81 MG tablet, Take by mouth., Disp: , Rfl:  .  Calcium-Vitamin D-Vitamin K (VIACTIV PO), Take by mouth., Disp: , Rfl:  .  furosemide (LASIX) 20 MG tablet, Take 1 tablet (20 mg total) by mouth every other day., Disp: 30 tablet, Rfl: 0 .  furosemide (LASIX) 20 MG tablet, Take by mouth., Disp: , Rfl:  .  hydrocortisone 2.5 % cream, APPLY TO AFFECTED AREA ON THE SKIN TWICE A DAY TO FACE UNTIL CLEAR, THEN AS NEEDED FLARES (Patient not taking: Reported on 05/07/2020), Disp: , Rfl: 1 .  OLANZapine (ZYPREXA) 7.5 MG tablet, Take 7.5 mg by mouth at bedtime., Disp: , Rfl: 3 .  Omega-3 Fatty Acids (FISH OIL PO), Take 500 mg by mouth 2 (two) times daily. , Disp: , Rfl:   Past Medical History: Past Medical History:  Diagnosis Date  . Anxiety     Tobacco Use: Social History   Tobacco Use  Smoking Status Former Smoker  . Packs/day: 1.00  . Years: 5.00  . Pack years: 5.00  . Types: Cigarettes  . Quit date: 08/07/1961  . Years since quitting: 58.9  Smokeless Tobacco Never Used    Labs: Recent Review Flowsheet Data   There is no flowsheet data to display.      Pulmonary Assessment Scores:  Pulmonary Assessment Scores    Row Name 05/13/20 1509         ADL UCSD   ADL Phase Entry     SOB Score total 42     Rest 2     Walk 3     Stairs 4     Bath 0     Dress 0       Shop 2       CAT Score   CAT Score 13       mMRC Score   mMRC Score 1            UCSD: Self-administered rating of dyspnea associated with activities of daily living (ADLs) 6-point scale (0 = "not at all" to 5 = "maximal or unable to do because of breathlessness")  Scoring Scores range from 0 to 120.  Minimally important difference is 5 units  CAT: CAT can identify the health impairment of COPD patients and is better correlated with disease progression.  CAT has a scoring range of zero to 40. The CAT score is classified into four groups of low (less than 10), medium (10 - 20), high (21-30) and very high (31-40) based on the impact level of disease on health status. A CAT score over 10 suggests significant symptoms.  A worsening CAT score could be explained by an exacerbation, poor medication adherence, poor inhaler technique, or progression of COPD or comorbid conditions.  CAT MCID is 2 points  mMRC: mMRC (Modified Medical Research Council) Dyspnea Scale is used to assess the  degree of baseline functional disability in patients of respiratory disease due to dyspnea. No minimal important difference is established. A decrease in score of 1 point or greater is considered a positive change.   Pulmonary Function Assessment:  Pulmonary Function Assessment - 05/07/20 0939      Breath   Shortness of Breath Yes;Limiting activity           Exercise Target Goals: Exercise Program Goal: Individual exercise prescription set using results from initial 6 min walk test and THRR while considering  patient's activity barriers and safety.   Exercise Prescription Goal: Initial exercise prescription builds to 30-45 minutes a day of aerobic activity, 2-3 days per week.  Home exercise guidelines will be given to patient during program as part of exercise prescription that the participant will acknowledge.  Education: Aerobic Exercise: - Group verbal and visual presentation on the components of  exercise prescription. Introduces F.I.T.T principle from ACSM for exercise prescriptions.  Reviews F.I.T.T. principles of aerobic exercise including progression. Written material given at graduation.   Education: Resistance Exercise: - Group verbal and visual presentation on the components of exercise prescription. Introduces F.I.T.T principle from ACSM for exercise prescriptions  Reviews F.I.T.T. principles of resistance exercise including progression. Written material given at graduation.    Education: Exercise & Equipment Safety: - Individual verbal instruction and demonstration of equipment use and safety with use of the equipment.   Pulmonary Rehab from 05/22/2020 in [facility] Cardiac and Pulmonary Rehab  Date 05/07/20  Educator [facility]  Instruction Review Code 1- Verbalizes Understanding      Education: Exercise Physiology & General Exercise Guidelines: - Group verbal and written instruction with models to review the exercise physiology of the cardiovascular system and associated critical values. Provides general exercise guidelines with specific guidelines to those with heart or lung disease.    Education: Flexibility, Balance, Mind/Body Relaxation: - Group verbal and visual presentation with interactive activity on the components of exercise prescription. Introduces F.I.T.T principle from ACSM for exercise prescriptions. Reviews F.I.T.T. principles of flexibility and balance exercise training including progression. Also discusses the mind body connection.  Reviews various relaxation techniques to help reduce and manage stress (i.e. Deep breathing, progressive muscle relaxation, and visualization). Balance handout provided to take home. Written material given at graduation.   Pulmonary Rehab from 05/22/2020 in [facility] Cardiac and Pulmonary Rehab  Date 05/22/20  Educator AS  Instruction Review Code 1- Verbalizes Understanding      Activity Barriers & Risk Stratification:  Activity Barriers &  Cardiac Risk Stratification - 05/13/20 1503      Activity Barriers & Cardiac Risk Stratification   Activity Barriers Deconditioning;Muscular Weakness;Shortness of Breath;Balance Concerns;Assistive Device;Neck/Spine Problems;Back Problems           6 Minute Walk:  6 Minute Walk    Row Name 05/13/20 1453         6 Minute Walk   Phase Initial     Distance 700 feet     Walk Time 6 minutes     # of Rest Breaks 0     MPH 1.33     METS 1.15     RPE 13     Perceived Dyspnea  3     VO2 Peak 4.04     Symptoms Yes (comment)     Comments SOB, shuffles feet, desaturations     Resting HR 76 bpm     Resting BP 136/68     Resting Oxygen Saturation  91 %  Exercise Oxygen Saturation  during 6 min walk 82 %     Max Ex. HR 105 bpm     Max Ex. BP 146/74     2 Minute Post BP 136/74       Interval HR   1 Minute HR 91     2 Minute HR 96     3 Minute HR 99     4 Minute HR 103     5 Minute HR 104     6 Minute HR 105     2 Minute Post HR 94     Interval Heart Rate? Yes       Interval Oxygen   Interval Oxygen? Yes     Baseline Oxygen Saturation % 91 %     1 Minute Oxygen Saturation % 86 %     1 Minute Liters of Oxygen 0 L  Room Air     2 Minute Oxygen Saturation % 84 %     2 Minute Liters of Oxygen 0 L     3 Minute Oxygen Saturation % 83 %     3 Minute Liters of Oxygen 0 L     4 Minute Oxygen Saturation % 83 %     4 Minute Liters of Oxygen 0 L     5 Minute Oxygen Saturation % 82 %     5 Minute Liters of Oxygen 0 L     6 Minute Oxygen Saturation % 82 %     6 Minute Liters of Oxygen 0 L     2 Minute Post Oxygen Saturation % 86 %     2 Minute Post Liters of Oxygen 0 L           Oxygen Initial Assessment:  Oxygen Initial Assessment - 05/07/20 0938      Home Oxygen   Home Oxygen Device None    Sleep Oxygen Prescription None    Home Exercise Oxygen Prescription None    Home Resting Oxygen Prescription None    Compliance with Home Oxygen Use Yes      Initial 6 min Walk    Oxygen Used None      Program Oxygen Prescription   Program Oxygen Prescription None      Intervention   Short Term Goals To learn and demonstrate proper use of respiratory medications;To learn and demonstrate proper pursed lip breathing techniques or other breathing techniques.;To learn and understand importance of maintaining oxygen saturations>88%;To learn and understand importance of monitoring SPO2 with pulse oximeter and demonstrate accurate use of the pulse oximeter.    Long  Term Goals Maintenance of O2 saturations>88%;Compliance with respiratory medication;Demonstrates proper use of MDI's;Exhibits proper breathing techniques, such as pursed lip breathing or other method taught during program session;Verbalizes importance of monitoring SPO2 with pulse oximeter and return demonstration           Oxygen Re-Evaluation:  Oxygen Re-Evaluation    Row Name 05/20/20 1111             Program Oxygen Prescription   Program Oxygen Prescription None         Home Oxygen   Home Oxygen Device None       Sleep Oxygen Prescription None       Home Exercise Oxygen Prescription None       Home Resting Oxygen Prescription None       Compliance with Home Oxygen Use Yes         Goals/Expected Outcomes   Short  Term Goals To learn and demonstrate proper use of respiratory medications;To learn and demonstrate proper pursed lip breathing techniques or other breathing techniques.;To learn and understand importance of maintaining oxygen saturations>88%;To learn and understand importance of monitoring SPO2 with pulse oximeter and demonstrate accurate use of the pulse oximeter.       Long  Term Goals Maintenance of O2 saturations>88%;Compliance with respiratory medication;Demonstrates proper use of MDI's;Exhibits proper breathing techniques, such as pursed lip breathing or other method taught during program session;Verbalizes importance of monitoring SPO2 with pulse oximeter and return demonstration         Comments Reviewed PLB technique with pt.  Talked about how it works and it's importance in maintaining their exercise saturations.       Goals/Expected Outcomes Short: Become more profiecient at using PLB.   Long: Become independent at using PLB.              Oxygen Discharge (Final Oxygen Re-Evaluation):  Oxygen Re-Evaluation - 05/20/20 1111      Program Oxygen Prescription   Program Oxygen Prescription None      Home Oxygen   Home Oxygen Device None    Sleep Oxygen Prescription None    Home Exercise Oxygen Prescription None    Home Resting Oxygen Prescription None    Compliance with Home Oxygen Use Yes      Goals/Expected Outcomes   Short Term Goals To learn and demonstrate proper use of respiratory medications;To learn and demonstrate proper pursed lip breathing techniques or other breathing techniques.;To learn and understand importance of maintaining oxygen saturations>88%;To learn and understand importance of monitoring SPO2 with pulse oximeter and demonstrate accurate use of the pulse oximeter.    Long  Term Goals Maintenance of O2 saturations>88%;Compliance with respiratory medication;Demonstrates proper use of MDI's;Exhibits proper breathing techniques, such as pursed lip breathing or other method taught during program session;Verbalizes importance of monitoring SPO2 with pulse oximeter and return demonstration    Comments Reviewed PLB technique with pt.  Talked about how it works and it's importance in maintaining their exercise saturations.    Goals/Expected Outcomes Short: Become more profiecient at using PLB.   Long: Become independent at using PLB.           Initial Exercise Prescription:  Initial Exercise Prescription - 05/13/20 1500      Date of Initial Exercise RX and Referring Provider   Date 05/13/20    Referring Provider Lanney Gins, Fraud MD      Treadmill   MPH 1.2    Grade 0    Minutes 15    METs 1.92      Recumbant Bike   Level 1    RPM 50     Watts 10    Minutes 15    METs 1.5      NuStep   Level 1    SPM 80    Minutes 15    METs 1.5      T5 Nustep   Level 1    SPM 80    Minutes 15    METs 1.5      Prescription Details   Frequency (times per week) 3    Duration Progress to 30 minutes of continuous aerobic without signs/symptoms of physical distress      Intensity   THRR 40-80% of Max Heartrate 100-125    Ratings of Perceived Exertion 11-13    Perceived Dyspnea 0-4      Progression   Progression Continue to progress workloads to maintain  intensity without signs/symptoms of physical distress.      Resistance Training   Training Prescription Yes    Weight 3 lb    Reps 10-15           Perform Capillary Blood Glucose checks as needed.  Exercise Prescription Changes:  Exercise Prescription Changes    Row Name 05/13/20 1500 05/27/20 1300           Response to Exercise   Blood Pressure (Admit) 136/68 104/60      Blood Pressure (Exercise) 146/74 128/70      Blood Pressure (Exit) 128/60 106/66      Heart Rate (Admit) 76 bpm 75 bpm      Heart Rate (Exercise) 105 bpm 103 bpm      Heart Rate (Exit) 90 bpm 95 bpm      Oxygen Saturation (Admit) 91 % 93 %      Oxygen Saturation (Exercise) 82 % 89 %      Oxygen Saturation (Exit) 90 % 90 %      Rating of Perceived Exertion (Exercise) 13 12      Perceived Dyspnea (Exercise) 3 1      Symptoms SOB --      Comments walk test results --      Duration -- Progress to 30 minutes of  aerobic without signs/symptoms of physical distress      Intensity -- THRR unchanged        Progression   Progression -- Continue to progress workloads to maintain intensity without signs/symptoms of physical distress.      Average METs -- 1.3        Resistance Training   Training Prescription -- Yes      Weight -- 3 lb      Reps -- 10-15        Treadmill   MPH -- 1.2      Grade -- 0      Minutes -- 15      METs -- 1.92        REL-XR   Level -- 1      Minutes -- 15              Exercise Comments:   Exercise Goals and Review:  Exercise Goals    Row Name 05/13/20 1505             Exercise Goals   Increase Physical Activity Yes       Intervention Provide advice, education, support and counseling about physical activity/exercise needs.;Develop an individualized exercise prescription for aerobic and resistive training based on initial evaluation findings, risk stratification, comorbidities and participant's personal goals.       Expected Outcomes Short Term: Attend rehab on a regular basis to increase amount of physical activity.;Long Term: Add in home exercise to make exercise part of routine and to increase amount of physical activity.;Long Term: Exercising regularly at least 3-5 days a week.       Increase Strength and Stamina Yes       Intervention Provide advice, education, support and counseling about physical activity/exercise needs.;Develop an individualized exercise prescription for aerobic and resistive training based on initial evaluation findings, risk stratification, comorbidities and participant's personal goals.       Expected Outcomes Short Term: Increase workloads from initial exercise prescription for resistance, speed, and METs.;Short Term: Perform resistance training exercises routinely during rehab and add in resistance training at home;Long Term: Improve cardiorespiratory fitness, muscular endurance and strength as measured by  increased METs and functional capacity (6MWT)       Able to understand and use rate of perceived exertion (RPE) scale Yes       Intervention Provide education and explanation on how to use RPE scale       Expected Outcomes Short Term: Able to use RPE daily in rehab to express subjective intensity level;Long Term:  Able to use RPE to guide intensity level when exercising independently       Able to understand and use Dyspnea scale Yes       Intervention Provide education and explanation on how to use Dyspnea scale        Expected Outcomes Short Term: Able to use Dyspnea scale daily in rehab to express subjective sense of shortness of breath during exertion;Long Term: Able to use Dyspnea scale to guide intensity level when exercising independently       Knowledge and understanding of Target Heart Rate Range (THRR) Yes       Intervention Provide education and explanation of THRR including how the numbers were predicted and where they are located for reference       Expected Outcomes Short Term: Able to state/look up THRR;Short Term: Able to use daily as guideline for intensity in rehab;Long Term: Able to use THRR to govern intensity when exercising independently       Able to check pulse independently Yes       Intervention Provide education and demonstration on how to check pulse in carotid and radial arteries.;Review the importance of being able to check your own pulse for safety during independent exercise       Expected Outcomes Short Term: Able to explain why pulse checking is important during independent exercise;Long Term: Able to check pulse independently and accurately       Understanding of Exercise Prescription Yes       Intervention Provide education, explanation, and written materials on patient's individual exercise prescription       Expected Outcomes Long Term: Able to explain home exercise prescription to exercise independently;Short Term: Able to explain program exercise prescription              Exercise Goals Re-Evaluation :  Exercise Goals Re-Evaluation    Row Name 05/20/20 1110 05/27/20 1336 06/10/20 1308         Exercise Goal Re-Evaluation   Exercise Goals Review Increase Physical Activity;Able to understand and use rate of perceived exertion (RPE) scale;Knowledge and understanding of Target Heart Rate Range (THRR);Understanding of Exercise Prescription;Increase Strength and Stamina;Able to check pulse independently;Able to understand and use Dyspnea scale Increase Physical  Activity;Increase Strength and Stamina --     Comments Reviewed RPE and dyspnea scales, THR and program prescription with pt today.  Pt voiced understanding and was given a copy of goals to take home. Elln has done well so far with exercise.  Staff are waiting to hear back from MD about oxygen. Out sick since last review.     Expected Outcomes Short: Use RPE daily to regulate intensity. Long: Follow program prescription in THR. Short: attend consistently Long:  build overall stamina --            Discharge Exercise Prescription (Final Exercise Prescription Changes):  Exercise Prescription Changes - 05/27/20 1300      Response to Exercise   Blood Pressure (Admit) 104/60    Blood Pressure (Exercise) 128/70    Blood Pressure (Exit) 106/66    Heart Rate (Admit) 75 bpm  Heart Rate (Exercise) 103 bpm    Heart Rate (Exit) 95 bpm    Oxygen Saturation (Admit) 93 %    Oxygen Saturation (Exercise) 89 %    Oxygen Saturation (Exit) 90 %    Rating of Perceived Exertion (Exercise) 12    Perceived Dyspnea (Exercise) 1    Duration Progress to 30 minutes of  aerobic without signs/symptoms of physical distress    Intensity THRR unchanged      Progression   Progression Continue to progress workloads to maintain intensity without signs/symptoms of physical distress.    Average METs 1.3      Resistance Training   Training Prescription Yes    Weight 3 lb    Reps 10-15      Treadmill   MPH 1.2    Grade 0    Minutes 15    METs 1.92      REL-XR   Level 1    Minutes 15           Nutrition:  Target Goals: Understanding of nutrition guidelines, daily intake of sodium 1500mg , cholesterol 200mg , calories 30% from fat and 7% or less from saturated fats, daily to have 5 or more servings of fruits and vegetables.  Education: All About Nutrition: -Group instruction provided by verbal, written material, interactive activities, discussions, models, and posters to present general guidelines for  heart healthy nutrition including fat, fiber, MyPlate, the role of sodium in heart healthy nutrition, utilization of the nutrition label, and utilization of this knowledge for meal planning. Follow up email sent as well. Written material given at graduation.   Biometrics:  Pre Biometrics - 05/13/20 1507      Pre Biometrics   Height 4' 11.5" (1.511 m)    Weight 138 lb 14.4 oz (63 kg)    BMI (Calculated) 27.6    Single Leg Stand 1.6 seconds            Nutrition Therapy Plan and Nutrition Goals:   Nutrition Assessments:  Nutrition Assessments - 05/13/20 1507      MEDFICTS Scores   Pre Score 99          MEDIFICTS Score Key:  ?70 Need to make dietary changes   40-70 Heart Healthy Diet  ? 40 Therapeutic Level Cholesterol Diet   Picture Your Plate Scores:  <11 Unhealthy dietary pattern with much room for improvement.  41-50 Dietary pattern unlikely to meet recommendations for good health and room for improvement.  51-60 More healthful dietary pattern, with some room for improvement.   >60 Healthy dietary pattern, although there may be some specific behaviors that could be improved.   Nutrition Goals Re-Evaluation:   Nutrition Goals Discharge (Final Nutrition Goals Re-Evaluation):   Psychosocial: Target Goals: Acknowledge presence or absence of significant depression and/or stress, maximize coping skills, provide positive support system. Participant is able to verbalize types and ability to use techniques and skills needed for reducing stress and depression.   Education: Stress, Anxiety, and Depression - Group verbal and visual presentation to define topics covered.  Reviews how body is impacted by stress, anxiety, and depression.  Also discusses healthy ways to reduce stress and to treat/manage anxiety and depression.  Written material given at graduation.   Education: Sleep Hygiene -Provides group verbal and written instruction about how sleep can affect your  health.  Define sleep hygiene, discuss sleep cycles and impact of sleep habits. Review good sleep hygiene tips.    Initial Review & Psychosocial Screening:  Initial  Psych Review & Screening - 05/07/20 0940      Initial Review   Current issues with Current Psychotropic Meds      Family Dynamics   Good Support System? Yes    Comments She can look to her Husband that live at [facility]. Her son lives nearby and she talkes to her daughter in [place]. She has a postive outlook on her health and mental health.      Barriers   Psychosocial barriers to participate in program There are no identifiable barriers or psychosocial needs.;The patient should benefit from training in stress management and relaxation.      Screening Interventions   Interventions To provide support and resources with identified psychosocial needs;Encouraged to exercise;Provide feedback about the scores to participant    Expected Outcomes Short Term goal: Utilizing psychosocial counselor, staff and physician to assist with identification of specific Stressors or current issues interfering with healing process. Setting desired goal for each stressor or current issue identified.;Long Term Goal: Stressors or current issues are controlled or eliminated.;Long Term goal: The participant improves quality of Life and PHQ9 Scores as seen by post scores and/or verbalization of changes;Short Term goal: Identification and review with participant of any Quality of Life or Depression concerns found by scoring the questionnaire.           Quality of Life Scores:  Scores of 19 and below usually indicate a poorer quality of life in these areas.  A difference of  2-3 points is a clinically meaningful difference.  A difference of 2-3 points in the total score of the Quality of Life Index has been associated with significant improvement in overall quality of life, self-image, physical symptoms, and general health in studies assessing change  in quality of life.  PHQ-9: Recent Review Flowsheet Data    Depression screen [facility] 2/9 05/13/2020   Decreased Interest 0   Down, Depressed, Hopeless 0   PHQ - 2 Score 0   Altered sleeping 0   Tired, decreased energy 0   Change in appetite 0   Feeling bad or failure about yourself  0   Trouble concentrating 0   Moving slowly or fidgety/restless 0   Suicidal thoughts 0   PHQ-9 Score 0   Difficult doing work/chores Not difficult at all     Interpretation of Total Score  Total Score Depression Severity:  1-4 = Minimal depression, 5-9 = Mild depression, 10-14 = Moderate depression, 15-19 = Moderately severe depression, 20-27 = Severe depression   Psychosocial Evaluation and Intervention:  Psychosocial Evaluation - 05/07/20 0943      Psychosocial Evaluation & Interventions   Interventions Encouraged to exercise with the program and follow exercise prescription    Comments She can look to her Husband that live at [facility] [place]. Her son lives nearby and she talkes to her daughter in [place]. She has a postive outlook on her health and mental health.    Expected Outcomes Short: Exercise regularly to support mental health and notify staff of any changes. Long: maintain mental health and well being through teaching of rehab or prescribed medications independently.    Continue Psychosocial Services  Follow up required by staff           Psychosocial Re-Evaluation:   Psychosocial Discharge (Final Psychosocial Re-Evaluation):   Education: Education Goals: Education classes will be provided on a weekly basis, covering required topics. Participant will state understanding/return demonstration of topics presented.  Learning Barriers/Preferences:  Learning Barriers/Preferences - 05/07/20 0940  Learning Barriers/Preferences   Learning Barriers Sight    Learning Preferences None           General Pulmonary Education Topics:  Infection Prevention: - Provides verbal and  written material to individual with discussion of infection control including proper hand washing and proper equipment cleaning during exercise session.   Pulmonary Rehab from 05/22/2020 in [facility] Cardiac and Pulmonary Rehab  Date 05/07/20  Educator [facility]  Instruction Review Code 1- Verbalizes Understanding      Falls Prevention: - Provides verbal and written material to individual with discussion of falls prevention and safety.   Pulmonary Rehab from 05/22/2020 in [facility] Cardiac and Pulmonary Rehab  Date 05/07/20  Educator [facility]  Instruction Review Code 1- Verbalizes Understanding      Chronic Lung Disease Review: - Group verbal instruction with posters, models, PowerPoint presentations and videos,  to review new updates, new respiratory medications, new advancements in procedures and treatments. Providing information on websites and "800" numbers for continued self-education. Includes information about supplement oxygen, available portable oxygen systems, continuous and intermittent flow rates, oxygen safety, concentrators, and Medicare reimbursement for oxygen. Explanation of Pulmonary Drugs, including class, frequency, complications, importance of spacers, rinsing mouth after steroid MDI's, and proper cleaning methods for nebulizers. Review of basic lung anatomy and physiology related to function, structure, and complications of lung disease. Review of risk factors. Discussion about methods for diagnosing sleep apnea and types of masks and machines for OSA. Includes a review of the use of types of environmental controls: home humidity, furnaces, filters, dust mite/pet prevention, HEPA vacuums. Discussion about weather changes, air quality and the benefits of nasal washing. Instruction on Warning signs, infection symptoms, calling MD promptly, preventive modes, and value of vaccinations. Review of effective airway clearance, coughing and/or vibration techniques. Emphasizing that all should Create an Action  Plan. Written material given at graduation.   Pulmonary Rehab from 05/22/2020 in [facility] Cardiac and Pulmonary Rehab  Education need identified 05/13/20      AED/CPR: - Group verbal and written instruction with the use of models to demonstrate the basic use of the AED with the basic ABC's of resuscitation.    Anatomy and Cardiac Procedures: - Group verbal and visual presentation and models provide information about basic cardiac anatomy and function. Reviews the testing methods done to diagnose heart disease and the outcomes of the test results. Describes the treatment choices: Medical Management, Angioplasty, or Coronary Bypass Surgery for treating various heart conditions including Myocardial Infarction, Angina, Valve Disease, and Cardiac Arrhythmias.  Written material given at graduation.   Medication Safety: - Group verbal and visual instruction to review commonly prescribed medications for heart and lung disease. Reviews the medication, class of the drug, and side effects. Includes the steps to properly store meds and maintain the prescription regimen.  Written material given at graduation.   Other: -Provides group and verbal instruction on various topics (see comments)   Knowledge Questionnaire Score:  Knowledge Questionnaire Score - 05/13/20 1507      Knowledge Questionnaire Score   Pre Score 16/18 Education Focus: O2 safety            Core Components/Risk Factors/Patient Goals at Admission:  Personal Goals and Risk Factors at Admission - 05/13/20 1507      Core Components/Risk Factors/Patient Goals on Admission    Weight Management Yes;Weight Loss    Intervention Weight Management: Develop a combined nutrition and exercise program designed to reach desired caloric intake, while maintaining appropriate intake of nutrient and  fiber, sodium and fats, and appropriate energy expenditure required for the weight goal.;Weight Management: Provide education and appropriate resources  to help participant work on and attain dietary goals.;Weight Management/Obesity: Establish reasonable short term and long term weight goals.    Admit Weight 138 lb 14.4 oz (63 kg)    Goal Weight: Short Term 133 lb (60.3 kg)    Goal Weight: Long Term 130 lb (59 kg)    Expected Outcomes Short Term: Continue to assess and modify interventions until short term weight is achieved;Long Term: Adherence to nutrition and physical activity/exercise program aimed toward attainment of established weight goal;Weight Loss: Understanding of general recommendations for a balanced deficit meal plan, which promotes 1-2 lb weight loss per week and includes a negative energy balance of [phone] kcal/d;Understanding of distribution of calorie intake throughout the day with the consumption of 4-5 meals/snacks;Understanding recommendations for meals to include 15-35% energy as protein, 25-35% energy from fat, 35-60% energy from carbohydrates, less than 200mg  of dietary cholesterol, 20-35 gm of total fiber daily    Improve shortness of breath with ADL's Yes    Intervention Provide education, individualized exercise plan and daily activity instruction to help decrease symptoms of SOB with activities of daily living.    Expected Outcomes Short Term: Improve cardiorespiratory fitness to achieve a reduction of symptoms when performing ADLs;Long Term: Be able to perform more ADLs without symptoms or delay the onset of symptoms    Hypertension Yes    Intervention Provide education on lifestyle modifcations including regular physical activity/exercise, weight management, moderate sodium restriction and increased consumption of fresh fruit, vegetables, and low fat dairy, alcohol moderation, and smoking cessation.;Monitor prescription use compliance.    Expected Outcomes Short Term: Continued assessment and intervention until BP is < 140/12mm HG in hypertensive participants. < 130/9mm HG in hypertensive participants with diabetes, heart  failure or chronic kidney disease.;Long Term: Maintenance of blood pressure at goal levels.           Education:Diabetes - Individual verbal and written instruction to review signs/symptoms of diabetes, desired ranges of glucose level fasting, after meals and with exercise. Acknowledge that pre and post exercise glucose checks will be done for 3 sessions at entry of program.   Know Your Numbers and Heart Failure: - Group verbal and visual instruction to discuss disease risk factors for cardiac and pulmonary disease and treatment options.  Reviews associated critical values for Overweight/Obesity, Hypertension, Cholesterol, and Diabetes.  Discusses basics of heart failure: signs/symptoms and treatments.  Introduces Heart Failure Zone chart for action plan for heart failure.  Written material given at graduation.   Core Components/Risk Factors/Patient Goals Review:    Core Components/Risk Factors/Patient Goals at Discharge (Final Review):    ITP Comments:  ITP Comments    Row Name 05/07/20 0951 05/13/20 1453 05/20/20 1110 05/22/20 0727 05/29/20 1124   ITP Comments Virtual Visit completed. Patient informed on EP and RD appointment and 6 Minute walk test. Patient also informed of patient health questionnaires on My Chart. Patient Verbalizes understanding. Visit diagnosis can be found in [facility] 04/11/2020. Completed 6MWT and gym orientation. Initial ITP created and sent for review to Dr. Emily Filbert, Medical Director. First full day of exercise!  Patient was oriented to gym and equipment including functions, settings, policies, and procedures.  Patient's individual exercise prescription and treatment plan were reviewed.  All starting workloads were established based on the results of the 6 minute walk test done at initial orientation visit.  The plan for exercise  progression was also introduced and progression will be customized based on patient's performance and goals. 30 Day review completed. Medical  Director ITP review done, changes made as directed, and signed approval by Medical Director. I would be ok with her SpO2 to be >80% with exertion.- Dr. Lanney Gins   Row Name 06/03/20 1006 06/10/20 1304 06/19/20 1001 06/19/20 1054     ITP Comments Dorian Pod called out again.  Had a negative COVID test.  Just got meds for cold today and hopes to return this week. Madyn called out again today.  Still not up to 100%, wants to wait and try on Wednesday. Mekia would like to discharge at this time due to her children not feeling comfortable with her coming to the hospital with the new COVID variant, she also feels like she has a good exercise program at [facility]. 30 Day review completed. Medical Director ITP review done, changes made as directed, and signed approval by Medical Director.  Discharged           Comments: Discharged

## 2020-06-19 NOTE — Progress Notes (Signed)
Pulmonary Individual Treatment Plan  Patient Details  Name: Shannon Page MRN: [id number] Date of Birth: [birth year] Referring Provider:     Pulmonary Rehab from 05/13/2020 in [facility] Cardiac and Pulmonary Rehab  Referring Provider Lanney Gins, Fraud MD      Initial Encounter Date:    Pulmonary Rehab from 05/13/2020 in [facility] Cardiac and Pulmonary Rehab  Date 05/13/20      Visit Diagnosis: No diagnosis found.  Patient's Home Medications on Admission:  Current Outpatient Medications:  .  albuterol (VENTOLIN HFA) 108 (90 Base) MCG/ACT inhaler, Inhale into the lungs., Disp: , Rfl:  .  aspirin EC 81 MG tablet, Take by mouth., Disp: , Rfl:  .  Calcium-Vitamin D-Vitamin K (VIACTIV PO), Take by mouth., Disp: , Rfl:  .  furosemide (LASIX) 20 MG tablet, Take 1 tablet (20 mg total) by mouth every other day., Disp: 30 tablet, Rfl: 0 .  furosemide (LASIX) 20 MG tablet, Take by mouth., Disp: , Rfl:  .  hydrocortisone 2.5 % cream, APPLY TO AFFECTED AREA ON THE SKIN TWICE A DAY TO FACE UNTIL CLEAR, THEN AS NEEDED FLARES (Patient not taking: Reported on 05/07/2020), Disp: , Rfl: 1 .  OLANZapine (ZYPREXA) 7.5 MG tablet, Take 7.5 mg by mouth at bedtime., Disp: , Rfl: 3 .  Omega-3 Fatty Acids (FISH OIL PO), Take 500 mg by mouth 2 (two) times daily. , Disp: , Rfl:   Past Medical History: Past Medical History:  Diagnosis Date  . Anxiety     Tobacco Use: Social History   Tobacco Use  Smoking Status Former Smoker  . Packs/day: 1.00  . Years: 5.00  . Pack years: 5.00  . Types: Cigarettes  . Quit date: 08/07/1961  . Years since quitting: 58.9  Smokeless Tobacco Never Used    Labs: Recent Review Flowsheet Data   There is no flowsheet data to display.      Pulmonary Assessment Scores:  Pulmonary Assessment Scores    Row Name 05/13/20 1509         ADL UCSD   ADL Phase Entry     SOB Score total 42     Rest 2     Walk 3     Stairs 4     Bath 0     Dress 0     Shop 2       CAT Score    CAT Score 13       mMRC Score   mMRC Score 1            UCSD: Self-administered rating of dyspnea associated with activities of daily living (ADLs) 6-point scale (0 = "not at all" to 5 = "maximal or unable to do because of breathlessness")  Scoring Scores range from 0 to 120.  Minimally important difference is 5 units  CAT: CAT can identify the health impairment of COPD patients and is better correlated with disease progression.  CAT has a scoring range of zero to 40. The CAT score is classified into four groups of low (less than 10), medium (10 - 20), high (21-30) and very high (31-40) based on the impact level of disease on health status. A CAT score over 10 suggests significant symptoms.  A worsening CAT score could be explained by an exacerbation, poor medication adherence, poor inhaler technique, or progression of COPD or comorbid conditions.  CAT MCID is 2 points  mMRC: mMRC (Modified Medical Research Council) Dyspnea Scale is used to assess the degree of baseline functional  disability in patients of respiratory disease due to dyspnea. No minimal important difference is established. A decrease in score of 1 point or greater is considered a positive change.   Pulmonary Function Assessment:  Pulmonary Function Assessment - 05/07/20 0939      Breath   Shortness of Breath Yes;Limiting activity           Exercise Target Goals: Exercise Program Goal: Individual exercise prescription set using results from initial 6 min walk test and THRR while considering  patient's activity barriers and safety.   Exercise Prescription Goal: Initial exercise prescription builds to 30-45 minutes a day of aerobic activity, 2-3 days per week.  Home exercise guidelines will be given to patient during program as part of exercise prescription that the participant will acknowledge.  Education: Aerobic Exercise: - Group verbal and visual presentation on the components of exercise prescription.  Introduces F.I.T.T principle from ACSM for exercise prescriptions.  Reviews F.I.T.T. principles of aerobic exercise including progression. Written material given at graduation.   Education: Resistance Exercise: - Group verbal and visual presentation on the components of exercise prescription. Introduces F.I.T.T principle from ACSM for exercise prescriptions  Reviews F.I.T.T. principles of resistance exercise including progression. Written material given at graduation.    Education: Exercise & Equipment Safety: - Individual verbal instruction and demonstration of equipment use and safety with use of the equipment.   Pulmonary Rehab from 05/22/2020 in [facility] Cardiac and Pulmonary Rehab  Date 05/07/20  Educator [facility]  Instruction Review Code 1- Verbalizes Understanding      Education: Exercise Physiology & General Exercise Guidelines: - Group verbal and written instruction with models to review the exercise physiology of the cardiovascular system and associated critical values. Provides general exercise guidelines with specific guidelines to those with heart or lung disease.    Education: Flexibility, Balance, Mind/Body Relaxation: - Group verbal and visual presentation with interactive activity on the components of exercise prescription. Introduces F.I.T.T principle from ACSM for exercise prescriptions. Reviews F.I.T.T. principles of flexibility and balance exercise training including progression. Also discusses the mind body connection.  Reviews various relaxation techniques to help reduce and manage stress (i.e. Deep breathing, progressive muscle relaxation, and visualization). Balance handout provided to take home. Written material given at graduation.   Pulmonary Rehab from 05/22/2020 in [facility] Cardiac and Pulmonary Rehab  Date 05/22/20  Educator [facility]  Instruction Review Code 1- Verbalizes Understanding      Activity Barriers & Risk Stratification:  Activity Barriers & Cardiac Risk  Stratification - 05/13/20 1503      Activity Barriers & Cardiac Risk Stratification   Activity Barriers Deconditioning;Muscular Weakness;Shortness of Breath;Balance Concerns;Assistive Device;Neck/Spine Problems;Back Problems           6 Minute Walk:  6 Minute Walk    Row Name 05/13/20 1453         6 Minute Walk   Phase Initial     Distance 700 feet     Walk Time 6 minutes     # of Rest Breaks 0     MPH 1.33     METS 1.15     RPE 13     Perceived Dyspnea  3     VO2 Peak 4.04     Symptoms Yes (comment)     Comments SOB, shuffles feet, desaturations     Resting HR 76 bpm     Resting BP 136/68     Resting Oxygen Saturation  91 %     Exercise Oxygen Saturation  during 6 min walk 82 %     Max Ex. HR 105 bpm     Max Ex. BP 146/74     2 Minute Post BP 136/74       Interval HR   1 Minute HR 91     2 Minute HR 96     3 Minute HR 99     4 Minute HR 103     5 Minute HR 104     6 Minute HR 105     2 Minute Post HR 94     Interval Heart Rate? Yes       Interval Oxygen   Interval Oxygen? Yes     Baseline Oxygen Saturation % 91 %     1 Minute Oxygen Saturation % 86 %     1 Minute Liters of Oxygen 0 L  Room Air     2 Minute Oxygen Saturation % 84 %     2 Minute Liters of Oxygen 0 L     3 Minute Oxygen Saturation % 83 %     3 Minute Liters of Oxygen 0 L     4 Minute Oxygen Saturation % 83 %     4 Minute Liters of Oxygen 0 L     5 Minute Oxygen Saturation % 82 %     5 Minute Liters of Oxygen 0 L     6 Minute Oxygen Saturation % 82 %     6 Minute Liters of Oxygen 0 L     2 Minute Post Oxygen Saturation % 86 %     2 Minute Post Liters of Oxygen 0 L           Oxygen Initial Assessment:  Oxygen Initial Assessment - 05/07/20 0938      Home Oxygen   Home Oxygen Device None    Sleep Oxygen Prescription None    Home Exercise Oxygen Prescription None    Home Resting Oxygen Prescription None    Compliance with Home Oxygen Use Yes      Initial 6 min Walk   Oxygen Used  None      Program Oxygen Prescription   Program Oxygen Prescription None      Intervention   Short Term Goals To learn and demonstrate proper use of respiratory medications;To learn and demonstrate proper pursed lip breathing techniques or other breathing techniques.;To learn and understand importance of maintaining oxygen saturations>88%;To learn and understand importance of monitoring SPO2 with pulse oximeter and demonstrate accurate use of the pulse oximeter.    Long  Term Goals Maintenance of O2 saturations>88%;Compliance with respiratory medication;Demonstrates proper use of MDI's;Exhibits proper breathing techniques, such as pursed lip breathing or other method taught during program session;Verbalizes importance of monitoring SPO2 with pulse oximeter and return demonstration           Oxygen Re-Evaluation:  Oxygen Re-Evaluation    Row Name 05/20/20 1111             Program Oxygen Prescription   Program Oxygen Prescription None         Home Oxygen   Home Oxygen Device None       Sleep Oxygen Prescription None       Home Exercise Oxygen Prescription None       Home Resting Oxygen Prescription None       Compliance with Home Oxygen Use Yes         Goals/Expected Outcomes   Short Term Goals To learn  and demonstrate proper use of respiratory medications;To learn and demonstrate proper pursed lip breathing techniques or other breathing techniques.;To learn and understand importance of maintaining oxygen saturations>88%;To learn and understand importance of monitoring SPO2 with pulse oximeter and demonstrate accurate use of the pulse oximeter.       Long  Term Goals Maintenance of O2 saturations>88%;Compliance with respiratory medication;Demonstrates proper use of MDI's;Exhibits proper breathing techniques, such as pursed lip breathing or other method taught during program session;Verbalizes importance of monitoring SPO2 with pulse oximeter and return demonstration       Comments  Reviewed PLB technique with pt.  Talked about how it works and it's importance in maintaining their exercise saturations.       Goals/Expected Outcomes Short: Become more profiecient at using PLB.   Long: Become independent at using PLB.              Oxygen Discharge (Final Oxygen Re-Evaluation):  Oxygen Re-Evaluation - 05/20/20 1111      Program Oxygen Prescription   Program Oxygen Prescription None      Home Oxygen   Home Oxygen Device None    Sleep Oxygen Prescription None    Home Exercise Oxygen Prescription None    Home Resting Oxygen Prescription None    Compliance with Home Oxygen Use Yes      Goals/Expected Outcomes   Short Term Goals To learn and demonstrate proper use of respiratory medications;To learn and demonstrate proper pursed lip breathing techniques or other breathing techniques.;To learn and understand importance of maintaining oxygen saturations>88%;To learn and understand importance of monitoring SPO2 with pulse oximeter and demonstrate accurate use of the pulse oximeter.    Long  Term Goals Maintenance of O2 saturations>88%;Compliance with respiratory medication;Demonstrates proper use of MDI's;Exhibits proper breathing techniques, such as pursed lip breathing or other method taught during program session;Verbalizes importance of monitoring SPO2 with pulse oximeter and return demonstration    Comments Reviewed PLB technique with pt.  Talked about how it works and it's importance in maintaining their exercise saturations.    Goals/Expected Outcomes Short: Become more profiecient at using PLB.   Long: Become independent at using PLB.           Initial Exercise Prescription:  Initial Exercise Prescription - 05/13/20 1500      Date of Initial Exercise RX and Referring Provider   Date 05/13/20    Referring Provider Lanney Gins, Fraud MD      Treadmill   MPH 1.2    Grade 0    Minutes 15    METs 1.92      Recumbant Bike   Level 1    RPM 50    Watts 10     Minutes 15    METs 1.5      NuStep   Level 1    SPM 80    Minutes 15    METs 1.5      T5 Nustep   Level 1    SPM 80    Minutes 15    METs 1.5      Prescription Details   Frequency (times per week) 3    Duration Progress to 30 minutes of continuous aerobic without signs/symptoms of physical distress      Intensity   THRR 40-80% of Max Heartrate 100-125    Ratings of Perceived Exertion 11-13    Perceived Dyspnea 0-4      Progression   Progression Continue to progress workloads to maintain intensity without signs/symptoms of physical  distress.      Resistance Training   Training Prescription Yes    Weight 3 lb    Reps 10-15           Perform Capillary Blood Glucose checks as needed.  Exercise Prescription Changes:  Exercise Prescription Changes    Row Name 05/13/20 1500 05/27/20 1300           Response to Exercise   Blood Pressure (Admit) 136/68 104/60      Blood Pressure (Exercise) 146/74 128/70      Blood Pressure (Exit) 128/60 106/66      Heart Rate (Admit) 76 bpm 75 bpm      Heart Rate (Exercise) 105 bpm 103 bpm      Heart Rate (Exit) 90 bpm 95 bpm      Oxygen Saturation (Admit) 91 % 93 %      Oxygen Saturation (Exercise) 82 % 89 %      Oxygen Saturation (Exit) 90 % 90 %      Rating of Perceived Exertion (Exercise) 13 12      Perceived Dyspnea (Exercise) 3 1      Symptoms SOB --      Comments walk test results --      Duration -- Progress to 30 minutes of  aerobic without signs/symptoms of physical distress      Intensity -- THRR unchanged        Progression   Progression -- Continue to progress workloads to maintain intensity without signs/symptoms of physical distress.      Average METs -- 1.3        Resistance Training   Training Prescription -- Yes      Weight -- 3 lb      Reps -- 10-15        Treadmill   MPH -- 1.2      Grade -- 0      Minutes -- 15      METs -- 1.92        REL-XR   Level -- 1      Minutes -- 15              Exercise Comments:   Exercise Goals and Review:  Exercise Goals    Row Name 05/13/20 1505             Exercise Goals   Increase Physical Activity Yes       Intervention Provide advice, education, support and counseling about physical activity/exercise needs.;Develop an individualized exercise prescription for aerobic and resistive training based on initial evaluation findings, risk stratification, comorbidities and participant's personal goals.       Expected Outcomes Short Term: Attend rehab on a regular basis to increase amount of physical activity.;Long Term: Add in home exercise to make exercise part of routine and to increase amount of physical activity.;Long Term: Exercising regularly at least 3-5 days a week.       Increase Strength and Stamina Yes       Intervention Provide advice, education, support and counseling about physical activity/exercise needs.;Develop an individualized exercise prescription for aerobic and resistive training based on initial evaluation findings, risk stratification, comorbidities and participant's personal goals.       Expected Outcomes Short Term: Increase workloads from initial exercise prescription for resistance, speed, and METs.;Short Term: Perform resistance training exercises routinely during rehab and add in resistance training at home;Long Term: Improve cardiorespiratory fitness, muscular endurance and strength as measured by increased METs and functional capacity (  6MWT)       Able to understand and use rate of perceived exertion (RPE) scale Yes       Intervention Provide education and explanation on how to use RPE scale       Expected Outcomes Short Term: Able to use RPE daily in rehab to express subjective intensity level;Long Term:  Able to use RPE to guide intensity level when exercising independently       Able to understand and use Dyspnea scale Yes       Intervention Provide education and explanation on how to use Dyspnea scale        Expected Outcomes Short Term: Able to use Dyspnea scale daily in rehab to express subjective sense of shortness of breath during exertion;Long Term: Able to use Dyspnea scale to guide intensity level when exercising independently       Knowledge and understanding of Target Heart Rate Range (THRR) Yes       Intervention Provide education and explanation of THRR including how the numbers were predicted and where they are located for reference       Expected Outcomes Short Term: Able to state/look up THRR;Short Term: Able to use daily as guideline for intensity in rehab;Long Term: Able to use THRR to govern intensity when exercising independently       Able to check pulse independently Yes       Intervention Provide education and demonstration on how to check pulse in carotid and radial arteries.;Review the importance of being able to check your own pulse for safety during independent exercise       Expected Outcomes Short Term: Able to explain why pulse checking is important during independent exercise;Long Term: Able to check pulse independently and accurately       Understanding of Exercise Prescription Yes       Intervention Provide education, explanation, and written materials on patient's individual exercise prescription       Expected Outcomes Long Term: Able to explain home exercise prescription to exercise independently;Short Term: Able to explain program exercise prescription              Exercise Goals Re-Evaluation :  Exercise Goals Re-Evaluation    Row Name 05/20/20 1110 05/27/20 1336 06/10/20 1308         Exercise Goal Re-Evaluation   Exercise Goals Review Increase Physical Activity;Able to understand and use rate of perceived exertion (RPE) scale;Knowledge and understanding of Target Heart Rate Range (THRR);Understanding of Exercise Prescription;Increase Strength and Stamina;Able to check pulse independently;Able to understand and use Dyspnea scale Increase Physical  Activity;Increase Strength and Stamina --     Comments Reviewed RPE and dyspnea scales, THR and program prescription with pt today.  Pt voiced understanding and was given a copy of goals to take home. Shannon Page has done well so far with exercise.  Staff are waiting to hear back from MD about oxygen. Out sick since last review.     Expected Outcomes Short: Use RPE daily to regulate intensity. Long: Follow program prescription in THR. Short: attend consistently Long:  build overall stamina --            Discharge Exercise Prescription (Final Exercise Prescription Changes):  Exercise Prescription Changes - 05/27/20 1300      Response to Exercise   Blood Pressure (Admit) 104/60    Blood Pressure (Exercise) 128/70    Blood Pressure (Exit) 106/66    Heart Rate (Admit) 75 bpm    Heart Rate (Exercise) 103 bpm  Heart Rate (Exit) 95 bpm    Oxygen Saturation (Admit) 93 %    Oxygen Saturation (Exercise) 89 %    Oxygen Saturation (Exit) 90 %    Rating of Perceived Exertion (Exercise) 12    Perceived Dyspnea (Exercise) 1    Duration Progress to 30 minutes of  aerobic without signs/symptoms of physical distress    Intensity THRR unchanged      Progression   Progression Continue to progress workloads to maintain intensity without signs/symptoms of physical distress.    Average METs 1.3      Resistance Training   Training Prescription Yes    Weight 3 lb    Reps 10-15      Treadmill   MPH 1.2    Grade 0    Minutes 15    METs 1.92      REL-XR   Level 1    Minutes 15           Nutrition:  Target Goals: Understanding of nutrition guidelines, daily intake of sodium 1500mg , cholesterol 200mg , calories 30% from fat and 7% or less from saturated fats, daily to have 5 or more servings of fruits and vegetables.  Education: All About Nutrition: -Group instruction provided by verbal, written material, interactive activities, discussions, models, and posters to present general guidelines for  heart healthy nutrition including fat, fiber, MyPlate, the role of sodium in heart healthy nutrition, utilization of the nutrition label, and utilization of this knowledge for meal planning. Follow up email sent as well. Written material given at graduation.   Biometrics:  Pre Biometrics - 05/13/20 1507      Pre Biometrics   Height 4' 11.5" (1.511 m)    Weight 138 lb 14.4 oz (63 kg)    BMI (Calculated) 27.6    Single Leg Stand 1.6 seconds            Nutrition Therapy Plan and Nutrition Goals:   Nutrition Assessments:  Nutrition Assessments - 05/13/20 1507      MEDFICTS Scores   Pre Score 99          MEDIFICTS Score Key:  ?70 Need to make dietary changes   40-70 Heart Healthy Diet  ? 40 Therapeutic Level Cholesterol Diet   Picture Your Plate Scores:  <93 Unhealthy dietary pattern with much room for improvement.  41-50 Dietary pattern unlikely to meet recommendations for good health and room for improvement.  51-60 More healthful dietary pattern, with some room for improvement.   >60 Healthy dietary pattern, although there may be some specific behaviors that could be improved.   Nutrition Goals Re-Evaluation:   Nutrition Goals Discharge (Final Nutrition Goals Re-Evaluation):   Psychosocial: Target Goals: Acknowledge presence or absence of significant depression and/or stress, maximize coping skills, provide positive support system. Participant is able to verbalize types and ability to use techniques and skills needed for reducing stress and depression.   Education: Stress, Anxiety, and Depression - Group verbal and visual presentation to define topics covered.  Reviews how body is impacted by stress, anxiety, and depression.  Also discusses healthy ways to reduce stress and to treat/manage anxiety and depression.  Written material given at graduation.   Education: Sleep Hygiene -Provides group verbal and written instruction about how sleep can affect your  health.  Define sleep hygiene, discuss sleep cycles and impact of sleep habits. Review good sleep hygiene tips.    Initial Review & Psychosocial Screening:  Initial Psych Review & Screening - 05/07/20 0940  Initial Review   Current issues with Current Psychotropic Meds      Family Dynamics   Good Support System? Yes    Comments She can look to her Husband that live at [facility]. Her son lives nearby and she talkes to her daughter in [place]. She has a postive outlook on her health and mental health.      Barriers   Psychosocial barriers to participate in program There are no identifiable barriers or psychosocial needs.;The patient should benefit from training in stress management and relaxation.      Screening Interventions   Interventions To provide support and resources with identified psychosocial needs;Encouraged to exercise;Provide feedback about the scores to participant    Expected Outcomes Short Term goal: Utilizing psychosocial counselor, staff and physician to assist with identification of specific Stressors or current issues interfering with healing process. Setting desired goal for each stressor or current issue identified.;Long Term Goal: Stressors or current issues are controlled or eliminated.;Long Term goal: The participant improves quality of Life and PHQ9 Scores as seen by post scores and/or verbalization of changes;Short Term goal: Identification and review with participant of any Quality of Life or Depression concerns found by scoring the questionnaire.           Quality of Life Scores:  Scores of 19 and below usually indicate a poorer quality of life in these areas.  A difference of  2-3 points is a clinically meaningful difference.  A difference of 2-3 points in the total score of the Quality of Life Index has been associated with significant improvement in overall quality of life, self-image, physical symptoms, and general health in studies assessing change  in quality of life.  PHQ-9: Recent Review Flowsheet Data    Depression screen [facility] 2/9 05/13/2020   Decreased Interest 0   Down, Depressed, Hopeless 0   PHQ - 2 Score 0   Altered sleeping 0   Tired, decreased energy 0   Change in appetite 0   Feeling bad or failure about yourself  0   Trouble concentrating 0   Moving slowly or fidgety/restless 0   Suicidal thoughts 0   PHQ-9 Score 0   Difficult doing work/chores Not difficult at all     Interpretation of Total Score  Total Score Depression Severity:  1-4 = Minimal depression, 5-9 = Mild depression, 10-14 = Moderate depression, 15-19 = Moderately severe depression, 20-27 = Severe depression   Psychosocial Evaluation and Intervention:  Psychosocial Evaluation - 05/07/20 0943      Psychosocial Evaluation & Interventions   Interventions Encouraged to exercise with the program and follow exercise prescription    Comments She can look to her Husband that live at [facility]. Her son lives nearby and she talkes to her daughter in [place]. She has a postive outlook on her health and mental health.    Expected Outcomes Short: Exercise regularly to support mental health and notify staff of any changes. Long: maintain mental health and well being through teaching of rehab or prescribed medications independently.    Continue Psychosocial Services  Follow up required by staff           Psychosocial Re-Evaluation:   Psychosocial Discharge (Final Psychosocial Re-Evaluation):   Education: Education Goals: Education classes will be provided on a weekly basis, covering required topics. Participant will state understanding/return demonstration of topics presented.  Learning Barriers/Preferences:  Learning Barriers/Preferences - 05/07/20 0940      Learning Barriers/Preferences   Learning Barriers Sight  Learning Preferences None           General Pulmonary Education Topics:  Infection Prevention: - Provides verbal and  written material to individual with discussion of infection control including proper hand washing and proper equipment cleaning during exercise session.   Pulmonary Rehab from 05/22/2020 in [facility] Cardiac and Pulmonary Rehab  Date 05/07/20  Educator [facility]  Instruction Review Code 1- Verbalizes Understanding      Falls Prevention: - Provides verbal and written material to individual with discussion of falls prevention and safety.   Pulmonary Rehab from 05/22/2020 in [facility] Cardiac and Pulmonary Rehab  Date 05/07/20  Educator [facility]  Instruction Review Code 1- Verbalizes Understanding      Chronic Lung Disease Review: - Group verbal instruction with posters, models, PowerPoint presentations and videos,  to review new updates, new respiratory medications, new advancements in procedures and treatments. Providing information on websites and "800" numbers for continued self-education. Includes information about supplement oxygen, available portable oxygen systems, continuous and intermittent flow rates, oxygen safety, concentrators, and Medicare reimbursement for oxygen. Explanation of Pulmonary Drugs, including class, frequency, complications, importance of spacers, rinsing mouth after steroid MDI's, and proper cleaning methods for nebulizers. Review of basic lung anatomy and physiology related to function, structure, and complications of lung disease. Review of risk factors. Discussion about methods for diagnosing sleep apnea and types of masks and machines for OSA. Includes a review of the use of types of environmental controls: home humidity, furnaces, filters, dust mite/pet prevention, HEPA vacuums. Discussion about weather changes, air quality and the benefits of nasal washing. Instruction on Warning signs, infection symptoms, calling MD promptly, preventive modes, and value of vaccinations. Review of effective airway clearance, coughing and/or vibration techniques. Emphasizing that all should Create an Action  Plan. Written material given at graduation.   Pulmonary Rehab from 05/22/2020 in [facility] Cardiac and Pulmonary Rehab  Education need identified 05/13/20      AED/CPR: - Group verbal and written instruction with the use of models to demonstrate the basic use of the AED with the basic ABC's of resuscitation.    Anatomy and Cardiac Procedures: - Group verbal and visual presentation and models provide information about basic cardiac anatomy and function. Reviews the testing methods done to diagnose heart disease and the outcomes of the test results. Describes the treatment choices: Medical Management, Angioplasty, or Coronary Bypass Surgery for treating various heart conditions including Myocardial Infarction, Angina, Valve Disease, and Cardiac Arrhythmias.  Written material given at graduation.   Medication Safety: - Group verbal and visual instruction to review commonly prescribed medications for heart and lung disease. Reviews the medication, class of the drug, and side effects. Includes the steps to properly store meds and maintain the prescription regimen.  Written material given at graduation.   Other: -Provides group and verbal instruction on various topics (see comments)   Knowledge Questionnaire Score:  Knowledge Questionnaire Score - 05/13/20 1507      Knowledge Questionnaire Score   Pre Score 16/18 Education Focus: O2 safety            Core Components/Risk Factors/Patient Goals at Admission:  Personal Goals and Risk Factors at Admission - 05/13/20 1507      Core Components/Risk Factors/Patient Goals on Admission    Weight Management Yes;Weight Loss    Intervention Weight Management: Develop a combined nutrition and exercise program designed to reach desired caloric intake, while maintaining appropriate intake of nutrient and fiber, sodium and fats, and appropriate energy expenditure required for  the weight goal.;Weight Management: Provide education and appropriate resources  to help participant work on and attain dietary goals.;Weight Management/Obesity: Establish reasonable short term and long term weight goals.    Admit Weight 138 lb 14.4 oz (63 kg)    Goal Weight: Short Term 133 lb (60.3 kg)    Goal Weight: Long Term 130 lb (59 kg)    Expected Outcomes Short Term: Continue to assess and modify interventions until short term weight is achieved;Long Term: Adherence to nutrition and physical activity/exercise program aimed toward attainment of established weight goal;Weight Loss: Understanding of general recommendations for a balanced deficit meal plan, which promotes 1-2 lb weight loss per week and includes a negative energy balance of [phone] kcal/d;Understanding of distribution of calorie intake throughout the day with the consumption of 4-5 meals/snacks;Understanding recommendations for meals to include 15-35% energy as protein, 25-35% energy from fat, 35-60% energy from carbohydrates, less than 200mg  of dietary cholesterol, 20-35 gm of total fiber daily    Improve shortness of breath with ADL's Yes    Intervention Provide education, individualized exercise plan and daily activity instruction to help decrease symptoms of SOB with activities of daily living.    Expected Outcomes Short Term: Improve cardiorespiratory fitness to achieve a reduction of symptoms when performing ADLs;Long Term: Be able to perform more ADLs without symptoms or delay the onset of symptoms    Hypertension Yes    Intervention Provide education on lifestyle modifcations including regular physical activity/exercise, weight management, moderate sodium restriction and increased consumption of fresh fruit, vegetables, and low fat dairy, alcohol moderation, and smoking cessation.;Monitor prescription use compliance.    Expected Outcomes Short Term: Continued assessment and intervention until BP is < 140/50mm HG in hypertensive participants. < 130/47mm HG in hypertensive participants with diabetes, heart  failure or chronic kidney disease.;Long Term: Maintenance of blood pressure at goal levels.           Education:Diabetes - Individual verbal and written instruction to review signs/symptoms of diabetes, desired ranges of glucose level fasting, after meals and with exercise. Acknowledge that pre and post exercise glucose checks will be done for 3 sessions at entry of program.   Know Your Numbers and Heart Failure: - Group verbal and visual instruction to discuss disease risk factors for cardiac and pulmonary disease and treatment options.  Reviews associated critical values for Overweight/Obesity, Hypertension, Cholesterol, and Diabetes.  Discusses basics of heart failure: signs/symptoms and treatments.  Introduces Heart Failure Zone chart for action plan for heart failure.  Written material given at graduation.   Core Components/Risk Factors/Patient Goals Review:    Core Components/Risk Factors/Patient Goals at Discharge (Final Review):    ITP Comments:  ITP Comments    Row Name 05/07/20 0951 05/13/20 1453 05/20/20 1110 05/22/20 0727 05/29/20 1124   ITP Comments Virtual Visit completed. Patient informed on EP and RD appointment and 6 Minute walk test. Patient also informed of patient health questionnaires on My Chart. Patient Verbalizes understanding. Visit diagnosis [facility] in [facility] 04/11/[facility]. Completed 6MWT and gym orientation. Initial ITP created and sent for review to Dr. Emily Filbert, Medical Director. First full day of exercise!  Patient was oriented to gym and equipment including functions, settings, policies, and procedures.  Patient's individual exercise prescription and treatment plan were reviewed.  All starting workloads were established based on the results of the 6 minute walk test done at initial orientation visit.  The plan for exercise progression was also introduced and progression will be customized based  on patient's performance and goals. 30 Day review completed. Medical  Director ITP review done, changes made as directed, and signed approval by Medical Director. I would be ok with her SpO2 to be >80% with exertion.- Dr. Lanney Gins   Row Name 06/03/20 1006 06/10/20 1304 06/19/20 1001       ITP Comments Shannon Page called out again.  Had a negative COVID test.  Just got meds for cold today and hopes to return this week. Shannon Page called out again today.  Still not up to 100%, wants to wait and try on Wednesday. Shannon Page would like to discharge at this time due to her children not feeling comfortable with her coming to the hospital with the new COVID variant, she also feels like she has a good exercise program at [facility].            Comments: Discharge ITP

## 2020-07-05 ENCOUNTER — Other Ambulatory Visit: Payer: Self-pay | Admitting: Internal Medicine

## 2020-07-05 DIAGNOSIS — R600 Localized edema: Secondary | ICD-10-CM

## 2020-07-05 DIAGNOSIS — R1310 Dysphagia, unspecified: Secondary | ICD-10-CM

## 2020-07-09 ENCOUNTER — Ambulatory Visit: Payer: Medicare Other

## 2020-07-10 ENCOUNTER — Ambulatory Visit
Admission: RE | Admit: 2020-07-10 | Discharge: 2020-07-10 | Disposition: A | Discharge status: Home / Self Care | Payer: Medicare Other | Source: Ambulatory Visit | Attending: Internal Medicine | Admitting: Internal Medicine

## 2020-07-10 ENCOUNTER — Other Ambulatory Visit: Payer: Self-pay

## 2020-07-10 DIAGNOSIS — R600 Localized edema: Secondary | ICD-10-CM

## 2020-07-10 DIAGNOSIS — R1310 Dysphagia, unspecified: Secondary | ICD-10-CM | POA: Diagnosis present

## 2020-07-26 ENCOUNTER — Ambulatory Visit: Payer: Medicare Other | Admitting: Internal Medicine

## 2020-08-07 ENCOUNTER — Ambulatory Visit: Payer: Medicare Other | Admitting: Internal Medicine

## 2020-08-12 ENCOUNTER — Ambulatory Visit (INDEPENDENT_AMBULATORY_CARE_PROVIDER_SITE_OTHER): Payer: Medicare Other | Admitting: Internal Medicine

## 2020-08-12 ENCOUNTER — Encounter: Payer: Self-pay | Admitting: Internal Medicine

## 2020-08-12 ENCOUNTER — Other Ambulatory Visit: Payer: Self-pay

## 2020-08-12 VITALS — BP 120/80 | HR 82 | Temp 98.2°F | Resp 18 | Ht 59.5 in | Wt 137.0 lb

## 2020-08-12 DIAGNOSIS — Z13220 Encounter for screening for lipoid disorders: Secondary | ICD-10-CM | POA: Diagnosis not present

## 2020-08-12 DIAGNOSIS — J9611 Chronic respiratory failure with hypoxia: Secondary | ICD-10-CM

## 2020-08-12 DIAGNOSIS — E538 Deficiency of other specified B group vitamins: Secondary | ICD-10-CM | POA: Diagnosis not present

## 2020-08-12 DIAGNOSIS — R1319 Other dysphagia: Secondary | ICD-10-CM

## 2020-08-12 DIAGNOSIS — R0609 Other forms of dyspnea: Secondary | ICD-10-CM

## 2020-08-12 DIAGNOSIS — R7301 Impaired fasting glucose: Secondary | ICD-10-CM

## 2020-08-12 DIAGNOSIS — R0600 Dyspnea, unspecified: Secondary | ICD-10-CM | POA: Diagnosis not present

## 2020-08-12 DIAGNOSIS — I872 Venous insufficiency (chronic) (peripheral): Secondary | ICD-10-CM

## 2020-08-12 DIAGNOSIS — E559 Vitamin D deficiency, unspecified: Secondary | ICD-10-CM

## 2020-08-12 LAB — VITAMIN B12: Vitamin B-12: 474 pg/mL (ref 211–911)

## 2020-08-12 LAB — CBC
HCT: 48.5 % — ABNORMAL HIGH (ref 36.0–46.0)
Hemoglobin: 15.7 g/dL — ABNORMAL HIGH (ref 12.0–15.0)
MCHC: 32.3 g/dL (ref 30.0–36.0)
MCV: 92.4 fl (ref 78.0–100.0)
Platelets: 214 10*3/uL (ref 150.0–400.0)
RBC: 5.25 Mil/uL — ABNORMAL HIGH (ref 3.87–5.11)
RDW: 15 % (ref 11.5–15.5)
WBC: 8.1 10*3/uL (ref 4.0–10.5)

## 2020-08-12 LAB — LIPID PANEL
Cholesterol: 137 mg/dL (ref 0–200)
HDL: 69.9 mg/dL (ref >39.00)
LDL Cholesterol: 53 mg/dL (ref 0–99)
NonHDL: 66.95
Total CHOL/HDL Ratio: 2
Triglycerides: 70 mg/dL (ref 0.0–149.0)
VLDL: 14 mg/dL (ref 0.0–40.0)

## 2020-08-12 LAB — COMPREHENSIVE METABOLIC PANEL
ALT: 23 U/L (ref 0–35)
AST: 28 U/L (ref 0–37)
Albumin: 4.2 g/dL (ref 3.5–5.2)
Alkaline Phosphatase: 139 U/L — ABNORMAL HIGH (ref 39–117)
BUN: 29 mg/dL — ABNORMAL HIGH (ref 6–23)
CO2: 29 mEq/L (ref 19–32)
Calcium: 10.1 mg/dL (ref 8.4–10.5)
Chloride: 101 mEq/L (ref 96–112)
Creatinine, Ser: 1.14 mg/dL (ref 0.40–1.20)
GFR: 44.32 mL/min — ABNORMAL LOW (ref >60.00)
Glucose, Bld: 81 mg/dL (ref 70–99)
Potassium: 4.6 mEq/L (ref 3.5–5.1)
Sodium: 138 mEq/L (ref 135–145)
Total Bilirubin: 0.6 mg/dL (ref 0.2–1.2)
Total Protein: 8.2 g/dL (ref 6.0–8.3)

## 2020-08-12 LAB — TSH: TSH: 5.32 u[IU]/mL — ABNORMAL HIGH (ref 0.35–4.50)

## 2020-08-12 LAB — VITAMIN D 25 HYDROXY (VIT D DEFICIENCY, FRACTURES): VITD: 40.19 ng/mL (ref 30.00–100.00)

## 2020-08-12 LAB — HEMOGLOBIN A1C: Hgb A1c MFr Bld: 6.3 % (ref 4.6–6.5)

## 2020-08-12 NOTE — Progress Notes (Signed)
   Subjective:   Patient ID: Shannon Page, female    DOB: 1933/09/25, 87 y.o.   MRN: [id number]  HPI The patient is a new 87 YO female coming in for ongoing medical care including her leg swelling (had [place] which was able to rule out blood clot, seeing vascular specialist in the upcoming future, recent ECHO without signs of heart failure, overall right more than left but both present, had been on fluid pill every other day for some time then daily then increased dose daily but this did not help her fluid significantly) and esophagus narrowing (problems with swallowing liquids at times, recent swallowing test done with significant narrowing, has upcoming EGD in March to treat this stricture, denies significant GERD symptoms or acid taste, is not taking any acid reducing medication) and SOB on exertion (has found PAD on CT scan, echo without heart failure, has not had cath recently to directly assess pressures, former smoker of small amount, quit >50 years ago).   PMH, [facility], social history reviewed and updated  Prior medical records available through care everywhere were also reviewed for this visit  Review of Systems  Constitutional: Negative.   HENT: Positive for trouble swallowing.   Eyes: Negative.   Respiratory: Positive for shortness of breath. Negative for cough and chest tightness.   Cardiovascular: Positive for leg swelling. Negative for chest pain and palpitations.  Gastrointestinal: Negative for abdominal distention, abdominal pain, constipation, diarrhea, nausea and vomiting.  Musculoskeletal: Negative.   Skin: Negative.   Neurological: Negative.   Psychiatric/Behavioral: Negative.     Objective:  Physical Exam Constitutional:      Appearance: She is well-developed and well-nourished.  HENT:     Head: Normocephalic and atraumatic.  Eyes:     Extraocular Movements: EOM normal.  Cardiovascular:     Rate and Rhythm: Normal rate and regular rhythm.  Pulmonary:     Effort:  Pulmonary effort is normal. No respiratory distress.     Breath sounds: Normal breath sounds. No wheezing or rales.  Abdominal:     General: Bowel sounds are normal. There is no distension.     Palpations: Abdomen is soft.     Tenderness: There is no abdominal tenderness. There is no rebound.  Musculoskeletal:     Cervical back: Normal range of motion.     Right lower leg: Edema present.     Left lower leg: Edema present.     Comments: 2+ edema to the knees bilaterally pitting  Skin:    General: Skin is warm and dry.  Neurological:     Mental Status: She is alert and oriented to person, place, and time.     Coordination: Coordination normal.  Psychiatric:        Mood and Affect: Mood and affect normal.     Vitals:   08/12/20 1054  BP: 120/80  Pulse: 82  Resp: 18  Temp: 98.2 F (36.8 C)  TempSrc: Oral  SpO2: 91%  Weight: 137 lb (62.1 kg)  Height: 4' 11.5" (1.511 m)   This visit occurred during the SARS-CoV-2 public health emergency.  Safety protocols were in place, including screening questions prior to the visit, additional usage of staff PPE, and extensive cleaning of exam room while observing appropriate contact time as indicated for disinfecting solutions.   Assessment & Plan:

## 2020-08-12 NOTE — Patient Instructions (Signed)
We are checking the blood work today and will call you back about the results.  

## 2020-08-13 ENCOUNTER — Ambulatory Visit: Payer: Medicare Other | Admitting: Internal Medicine

## 2020-08-14 ENCOUNTER — Ambulatory Visit (INDEPENDENT_AMBULATORY_CARE_PROVIDER_SITE_OTHER): Payer: Medicare Other | Admitting: Podiatry

## 2020-08-14 ENCOUNTER — Encounter: Payer: Self-pay | Admitting: Podiatry

## 2020-08-14 ENCOUNTER — Other Ambulatory Visit: Payer: Self-pay

## 2020-08-14 DIAGNOSIS — M79609 Pain in unspecified limb: Secondary | ICD-10-CM

## 2020-08-14 DIAGNOSIS — B351 Tinea unguium: Secondary | ICD-10-CM | POA: Diagnosis not present

## 2020-08-14 DIAGNOSIS — D2372 Other benign neoplasm of skin of left lower limb, including hip: Secondary | ICD-10-CM | POA: Diagnosis not present

## 2020-08-14 DIAGNOSIS — D2371 Other benign neoplasm of skin of right lower limb, including hip: Secondary | ICD-10-CM | POA: Diagnosis not present

## 2020-08-14 NOTE — Assessment & Plan Note (Signed)
Getting EGD in the near future for likely dilation. We talked about how depending on findings she may need acid reducing medicine to help prevent recurrence.

## 2020-08-14 NOTE — Assessment & Plan Note (Signed)
Taking lasix 20 mg daily and this does not seem to be helping significantly. We discussed in detail today how swelling tends to become chronic the longer you have had it as well as the venous structure and leaking valves which can cause edema. She has had echo recently which was reviewed with her and her husband which is normal and we are checking CMP today to rule out kidney or liver dysfunction as cause.

## 2020-08-14 NOTE — Assessment & Plan Note (Addendum)
Does have some reported low O2 sats while walking at home. Today O2 saturation after walking is 91% on room air. Does not currently qualify for home oxygen but will monitor closely. She does have [facility] on imaging but no direct measurements through cath at this time.

## 2020-08-14 NOTE — Progress Notes (Signed)
She presents today chief complaint of painfully elongated toenails and calluses bilaterally.  Objective: Pulses remain palpable toenails are long thick yellow dystrophic Lee mycotic painful palpation with reactive hyperkeratotic lesions plantarly.  Assessment: Pain in limb secondary to onychomycosis and porokeratosis.  Plan: Debridement of all reactive hyperkeratotic tissue debridement of toenails one through five bilateral.

## 2020-08-14 NOTE — Assessment & Plan Note (Signed)
Unclear if this could be related to the pulmonary artery hypertension noted on CT. No other acute findings on CT chest from 2021. Recent ECHO without heart failure.

## 2020-08-19 ENCOUNTER — Ambulatory Visit (INDEPENDENT_AMBULATORY_CARE_PROVIDER_SITE_OTHER): Payer: Medicare Other

## 2020-08-19 ENCOUNTER — Other Ambulatory Visit (INDEPENDENT_AMBULATORY_CARE_PROVIDER_SITE_OTHER): Payer: Self-pay | Admitting: Nurse Practitioner

## 2020-08-19 ENCOUNTER — Encounter (INDEPENDENT_AMBULATORY_CARE_PROVIDER_SITE_OTHER): Payer: Medicare Other | Admitting: Vascular Surgery

## 2020-08-19 ENCOUNTER — Other Ambulatory Visit: Payer: Self-pay

## 2020-08-19 DIAGNOSIS — M79604 Pain in right leg: Secondary | ICD-10-CM

## 2020-08-19 DIAGNOSIS — M79605 Pain in left leg: Secondary | ICD-10-CM | POA: Diagnosis not present

## 2020-09-05 ENCOUNTER — Encounter (INDEPENDENT_AMBULATORY_CARE_PROVIDER_SITE_OTHER): Payer: Self-pay | Admitting: Vascular Surgery

## 2020-09-05 ENCOUNTER — Other Ambulatory Visit: Payer: Self-pay

## 2020-09-05 ENCOUNTER — Ambulatory Visit (INDEPENDENT_AMBULATORY_CARE_PROVIDER_SITE_OTHER): Payer: Medicare Other | Admitting: Vascular Surgery

## 2020-09-05 VITALS — BP 136/88 | HR 92 | Ht 59.0 in | Wt 133.0 lb

## 2020-09-05 DIAGNOSIS — I890 Lymphedema, not elsewhere classified: Secondary | ICD-10-CM | POA: Diagnosis not present

## 2020-09-05 DIAGNOSIS — I872 Venous insufficiency (chronic) (peripheral): Secondary | ICD-10-CM

## 2020-09-05 NOTE — Progress Notes (Signed)
MRN : [id number]  Shannon Page is a 87 y.o. ([birth year]) female who presents with chief complaint of  Chief Complaint  Patient presents with  . New Patient (Initial Visit)    Hande. BLE LE pain   .  History of Present Illness:   Patient is seen for evaluation of leg pain and leg swelling. The patient first noticed the swelling remotely. The swelling is associated with pain and discoloration. The pain and swelling worsens with prolonged dependency and improves with elevation. The pain is unrelated to activity.  The patient notes that in the morning the legs are significantly improved but they steadily worsened throughout the course of the day. The patient also notes a steady worsening of the discoloration in the ankle and shin area.   The patient denies claudication symptoms.  The patient denies symptoms consistent with rest pain.  The patient denies and extensive history of DJD and LS spine disease.  The patient has no had any past angiography, interventions or vascular surgery.  Elevation makes the leg symptoms better, dependency makes them much worse. There is no history of ulcerations. The patient denies any recent changes in medications.  The patient has been wearing graduated compression on a daily basis and she finds the open toe stockings work the best.  The patient denies a history of DVT or PE. There is no prior history of phlebitis. There is no history of primary lymphedema.  No history of malignancies. No history of trauma or groin or pelvic surgery. There is no history of radiation treatment to the groin or pelvis  The patient denies amaurosis fugax or recent TIA symptoms. There are no recent neurological changes noted. The patient denies recent episodes of angina or shortness of breath.  Duplex ultrasound of the venous system is normal, no acute or chronic changes noted.  No outpatient medications have been marked as taking for the 09/05/20 encounter (Office Visit)  with Delana Meyer, Dolores Lory, MD.    Past Medical History:  Diagnosis Date  . Anxiety   . Chicken pox   . Hay fever   . History of frequent urinary tract infections   . Hypertension     Past Surgical History:  Procedure Laterality Date  . APPENDECTOMY    . ESOPHAGOGASTRODUODENOSCOPY (EGD) WITH PROPOFOL N/A 08/01/2018   Procedure: ESOPHAGOGASTRODUODENOSCOPY (EGD) WITH PROPOFOL;  Surgeon: Virgel Manifold, MD;  Location: [facility] ENDOSCOPY;  Service: Endoscopy;  Laterality: N/A;  . TONSILLECTOMY AND ADENOIDECTOMY      Social History Social History   Tobacco Use  . Smoking status: Former Smoker    Packs/day: 1.00    Years: 5.00    Pack years: 5.00    Types: Cigarettes    Quit date: 08/07/1961    Years since quitting: 59.1  . Smokeless tobacco: Never Used  Vaping Use  . Vaping Use: Never used  Substance Use Topics  . Alcohol use: Yes    Alcohol/week: 5.0 standard drinks    Types: 5 Cans of beer per week  . Drug use: No    Family History Family History  Problem Relation Age of Onset  . Cancer Mother   . Hypertension Mother   . Alcohol abuse Father   . Early death Father   . Heart disease Brother   . Hypertension Maternal Grandfather   . Breast cancer Neg Hx   No family history of bleeding/clotting disorders, porphyria or autoimmune disease   Allergies  Allergen Reactions  . Doxycycline Other (See  Comments)  . Moxifloxacin Other (See Comments)    tendonitis  . Atenolol     Dizziness   . Avelox [Moxifloxacin Hcl In Nacl]   . Clindamycin Rash  . Clindamycin/Lincomycin Rash  . Latex Rash  . Nystatin Rash  . Penicillins Rash     REVIEW OF SYSTEMS (Negative unless checked)  Constitutional: [] Weight loss  [] Fever  [] Chills Cardiac: [] Chest pain   [] Chest pressure   [] Palpitations   [] Shortness of breath when laying flat   [] Shortness of breath with exertion. Vascular:  [] Pain in legs with walking   [x] Pain in legs at rest  [] History of DVT   [] Phlebitis    [x] Swelling in legs   [] Varicose veins   [] Non-healing ulcers Pulmonary:   [] Uses home oxygen   [] Productive cough   [] Hemoptysis   [] Wheeze  [] COPD   [] Asthma Neurologic:  [] Dizziness   [] Seizures   [] History of stroke   [] History of TIA  [] Aphasia   [] Vissual changes   [] Weakness or numbness in arm   [] Weakness or numbness in leg Musculoskeletal:   [] Joint swelling   [x] Joint pain   [x] Low back pain Hematologic:  [] Easy bruising  [] Easy bleeding   [] Hypercoagulable state   [] Anemic Gastrointestinal:  [] Diarrhea   [] Vomiting  [] Gastroesophageal reflux/heartburn   [] Difficulty swallowing. Genitourinary:  [] Chronic kidney disease   [] Difficult urination  [] Frequent urination   [] Blood in urine Skin:  [] Rashes   [] Ulcers  Psychological:  [] History of anxiety   []  History of major depression.  Physical Examination  Vitals:   09/05/20 1313  BP: 136/88  Pulse: 92  Weight: 133 lb (60.3 kg)  Height: 4\' 11"  (1.499 m)   Body mass index is 26.86 kg/m. Gen: WD/WN, NAD Head: [place]/AT, No temporalis wasting.  Ear/Nose/Throat: Hearing grossly intact, nares w/o erythema or drainage, poor dentition Eyes: PER, EOMI, sclera nonicteric.  Neck: Supple, no masses.  No bruit or JVD.  Pulmonary:  Good air movement, clear to auscultation bilaterally, no use of accessory muscles.  Cardiac: RRR, normal S1, S2, no Murmurs. Vascular: scattered varicosities present bilaterally.  Moderate to severe venous stasis changes to the legs bilaterally.  3+ soft pitting edema. Vessel Right Left  Radial Palpable Palpable  PT Palpable Palpable  DP Palpable Palpable  Gastrointestinal: soft, non-distended. No guarding/no peritoneal signs.  Musculoskeletal: M/S 5/5 throughout.  No deformity or atrophy.  Neurologic: CN 2-12 intact. Pain and light touch intact in extremities.  Symmetrical.  Speech is fluent. Motor exam as listed above. Psychiatric: Judgment intact, Mood & affect appropriate for pt's clinical  situation. Dermatologic: Moderate to severe venous rashes no ulcers noted.  No changes consistent with cellulitis.   CBC Lab Results  Component Value Date   WBC 8.1 08/12/2020   HGB 15.7 (H) 08/12/2020   HCT 48.5 (H) 08/12/2020   MCV 92.4 08/12/2020   PLT 214.0 08/12/2020    BMET    Component Value Date/Time   NA 138 08/12/2020 1141   K 4.6 08/12/2020 1141   CL 101 08/12/2020 1141   CO2 29 08/12/2020 1141   GLUCOSE 81 08/12/2020 1141   BUN 29 (H) 08/12/2020 1141   CREATININE 1.14 08/12/2020 1141   CALCIUM 10.1 08/12/2020 1141   GFRNONAA 36 (L) 02/29/2020 0444   GFRAA 42 (L) 02/29/2020 0444   CrCl cannot be calculated (Patient's most recent lab result is older than the maximum 21 days allowed.).  COAG No results found for: INR, PROTIME  Radiology VAS [place] ABI WITH/WO TBI  Result Date: 08/26/2020 LOWER EXTREMITY DOPPLER STUDY Indications: Rest pain.  Performing Technologist: Almira Coaster RVS  Examination Guidelines: A complete evaluation includes at minimum, Doppler waveform signals and systolic blood pressure reading at the level of bilateral brachial, anterior tibial, and posterior tibial arteries, when vessel segments are accessible. Bilateral testing is considered an integral part of a complete examination. Photoelectric Plethysmograph (PPG) waveforms and toe systolic pressure readings are included as required and additional duplex testing as needed. Limited examinations for reoccurring indications may be performed as noted.  ABI Findings: +---------+------------------+-----+---------+--------+ Right    Rt Pressure (mmHg)IndexWaveform Comment  +---------+------------------+-----+---------+--------+ Brachial 136                                      +---------+------------------+-----+---------+--------+ ATA      142               1.03 triphasic         +---------+------------------+-----+---------+--------+ PTA      156               1.13 triphasic          +---------+------------------+-----+---------+--------+ Great Toe127               0.92 Normal            +---------+------------------+-----+---------+--------+ +---------+------------------+-----+---------+-------+ Left     Lt Pressure (mmHg)IndexWaveform Comment +---------+------------------+-----+---------+-------+ Brachial 138                                     +---------+------------------+-----+---------+-------+ ATA      137               0.99 triphasic        +---------+------------------+-----+---------+-------+ PTA      154               1.12 triphasic        +---------+------------------+-----+---------+-------+ Great Toe111               0.80 Normal           +---------+------------------+-----+---------+-------+ +-------+-----------+-----------+------------+------------+ ABI/TBIToday's ABIToday's TBIPrevious ABIPrevious TBI +-------+-----------+-----------+------------+------------+ Right  1.13       .92                                 +-------+-----------+-----------+------------+------------+ Left   1.12       .80                                 +-------+-----------+-----------+------------+------------+  Summary: Right: Resting right ankle-brachial index is within normal range. No evidence of significant right lower extremity arterial disease. The right toe-brachial index is normal. Left: Resting left ankle-brachial index is within normal range. No evidence of significant left lower extremity arterial disease. The left toe-brachial index is normal.  *See table(s) above for measurements and observations.  Electronically signed by Hortencia Pilar MD on 08/26/2020 at 1:45:23 PM.   Final      Assessment/Plan 1. Venous insufficiency, peripheral Recommend:  No surgery or intervention at this point in time.    I have reviewed my previous discussion with the patient regarding swelling and why it causes symptoms.  Patient will continue wearing  graduated compression stockings class 1 (20-30 mmHg)  on a daily basis. The patient will  beginning wearing the stockings first thing in the morning and removing them in the evening. The patient is instructed specifically not to sleep in the stockings.    In addition, behavioral modification including several periods of elevation of the lower extremities during the day will be continued.  This was reviewed with the patient during the initial visit.  The patient will also continue routine exercise, especially walking on a daily basis as was discussed during the initial visit.    Despite conservative treatments including graduated compression therapy class 1 and behavioral modification including exercise and elevation the patient  has not obtained adequate control of the lymphedema.  The patient still has stage 3 lymphedema and therefore, I believe that a lymph pump should be added to improve the control of the patient's lymphedema.  Additionally, a lymph pump is warranted because it will reduce the risk of cellulitis and ulceration in the future.  Patient should follow-up in six months    2. Lymphedema Recommend:  No surgery or intervention at this point in time.    I have reviewed my previous discussion with the patient regarding swelling and why it causes symptoms.  Patient will continue wearing graduated compression stockings class 1 (20-30 mmHg) on a daily basis. The patient will  beginning wearing the stockings first thing in the morning and removing them in the evening. The patient is instructed specifically not to sleep in the stockings.    In addition, behavioral modification including several periods of elevation of the lower extremities during the day will be continued.  This was reviewed with the patient during the initial visit.  The patient will also continue routine exercise, especially walking on a daily basis as was discussed during the initial visit.    Despite conservative  treatments including graduated compression therapy class 1 and behavioral modification including exercise and elevation the patient  has not obtained adequate control of the lymphedema.  The patient still has stage 3 lymphedema and therefore, I believe that a lymph pump should be added to improve the control of the patient's lymphedema.  Additionally, a lymph pump is warranted because it will reduce the risk of cellulitis and ulceration in the future.  Patient should follow-up in six months      Hortencia Pilar, MD  09/05/2020 1:15 PM

## 2020-09-26 ENCOUNTER — Other Ambulatory Visit
Admission: RE | Admit: 2020-09-26 | Discharge: 2020-09-26 | Disposition: A | Discharge status: Home / Self Care | Payer: Medicare Other | Source: Ambulatory Visit | Attending: Gastroenterology | Admitting: Gastroenterology

## 2020-09-26 ENCOUNTER — Other Ambulatory Visit: Payer: Self-pay

## 2020-09-26 DIAGNOSIS — Z01812 Encounter for preprocedural laboratory examination: Secondary | ICD-10-CM | POA: Diagnosis present

## 2020-09-26 DIAGNOSIS — Z20822 Contact with and (suspected) exposure to covid-19: Secondary | ICD-10-CM | POA: Insufficient documentation

## 2020-09-26 LAB — SARS CORONAVIRUS 2 (TAT 6-24 HRS): SARS Coronavirus 2: NEGATIVE

## 2020-09-27 ENCOUNTER — Encounter: Payer: Self-pay | Admitting: *Deleted

## 2020-09-30 ENCOUNTER — Encounter: Payer: Self-pay | Admitting: *Deleted

## 2020-09-30 ENCOUNTER — Ambulatory Visit: Payer: Medicare Other | Admitting: Certified Registered Nurse Anesthetist

## 2020-09-30 ENCOUNTER — Encounter
Admission: RE | Disposition: A | Discharge status: Home / Self Care | Payer: Self-pay | Source: Home / Self Care | Attending: Gastroenterology

## 2020-09-30 ENCOUNTER — Other Ambulatory Visit: Payer: Self-pay

## 2020-09-30 ENCOUNTER — Ambulatory Visit
Admission: RE | Admit: 2020-09-30 | Discharge: 2020-09-30 | Disposition: A | Discharge status: Home / Self Care | Payer: Medicare Other | Attending: Gastroenterology | Admitting: Gastroenterology

## 2020-09-30 DIAGNOSIS — K2289 Other specified disease of esophagus: Secondary | ICD-10-CM | POA: Diagnosis not present

## 2020-09-30 DIAGNOSIS — Z7982 Long term (current) use of aspirin: Secondary | ICD-10-CM | POA: Diagnosis not present

## 2020-09-30 DIAGNOSIS — K2950 Unspecified chronic gastritis without bleeding: Secondary | ICD-10-CM | POA: Insufficient documentation

## 2020-09-30 DIAGNOSIS — Z79899 Other long term (current) drug therapy: Secondary | ICD-10-CM | POA: Insufficient documentation

## 2020-09-30 DIAGNOSIS — Z880 Allergy status to penicillin: Secondary | ICD-10-CM | POA: Diagnosis not present

## 2020-09-30 DIAGNOSIS — Z888 Allergy status to other drugs, medicaments and biological substances status: Secondary | ICD-10-CM | POA: Diagnosis not present

## 2020-09-30 DIAGNOSIS — Z881 Allergy status to other antibiotic agents status: Secondary | ICD-10-CM | POA: Insufficient documentation

## 2020-09-30 DIAGNOSIS — R1310 Dysphagia, unspecified: Secondary | ICD-10-CM | POA: Diagnosis not present

## 2020-09-30 DIAGNOSIS — Z91040 Latex allergy status: Secondary | ICD-10-CM | POA: Diagnosis not present

## 2020-09-30 DIAGNOSIS — R933 Abnormal findings on diagnostic imaging of other parts of digestive tract: Secondary | ICD-10-CM | POA: Diagnosis present

## 2020-09-30 HISTORY — DX: Brief psychotic disorder: F23

## 2020-09-30 HISTORY — DX: Adjustment insomnia: F51.02

## 2020-09-30 HISTORY — DX: Other pulmonary embolism without acute cor pulmonale: I26.99

## 2020-09-30 HISTORY — PX: ESOPHAGOGASTRODUODENOSCOPY (EGD) WITH PROPOFOL: SHX5813

## 2020-09-30 HISTORY — DX: Insomnia, unspecified: G47.00

## 2020-09-30 HISTORY — DX: Disorder of thyroid, unspecified: E07.9

## 2020-09-30 HISTORY — DX: Other specified disorders of bone density and structure, unspecified site: M85.80

## 2020-09-30 HISTORY — DX: Idiopathic aseptic necrosis of unspecified humerus: M87.029

## 2020-09-30 HISTORY — DX: Other idiopathic scoliosis, site unspecified: M41.20

## 2020-09-30 LAB — KOH PREP

## 2020-09-30 SURGERY — ESOPHAGOGASTRODUODENOSCOPY (EGD) WITH PROPOFOL
Anesthesia: General

## 2020-09-30 MED ORDER — PHENYLEPHRINE HCL (PRESSORS) 10 MG/ML IV SOLN
INTRAVENOUS | Status: DC | PRN
  Administered 2020-09-30: 100 ug via INTRAVENOUS

## 2020-09-30 MED ORDER — PROPOFOL 500 MG/50ML IV EMUL
INTRAVENOUS | Status: DC | PRN
  Administered 2020-09-30: 125 ug/kg/min via INTRAVENOUS

## 2020-09-30 MED ORDER — PROPOFOL 500 MG/50ML IV EMUL
INTRAVENOUS | Status: AC
  Filled 2020-09-30: qty 50

## 2020-09-30 MED ORDER — LIDOCAINE HCL (CARDIAC) PF 100 MG/5ML IV SOSY
PREFILLED_SYRINGE | INTRAVENOUS | Status: DC | PRN
  Administered 2020-09-30: 50 mg via INTRAVENOUS

## 2020-09-30 MED ORDER — PROPOFOL 10 MG/ML IV BOLUS
INTRAVENOUS | Status: DC | PRN
  Administered 2020-09-30: 50 mg via INTRAVENOUS

## 2020-09-30 MED ORDER — SODIUM CHLORIDE 0.9 % IV SOLN: INTRAVENOUS | Status: DC

## 2020-09-30 MED ORDER — PROPOFOL 10 MG/ML IV BOLUS
INTRAVENOUS | Status: AC
  Filled 2020-09-30: qty 20

## 2020-09-30 NOTE — Anesthesia Postprocedure Evaluation (Signed)
Anesthesia Post Note  Patient: Shannon Page  Procedure(s) Performed: ESOPHAGOGASTRODUODENOSCOPY (EGD) WITH PROPOFOL (N/A )  Patient location during evaluation: Endoscopy Anesthesia Type: General Level of consciousness: awake and alert Pain management: pain level controlled Vital Signs Assessment: post-procedure vital signs reviewed and stable Respiratory status: spontaneous breathing, nonlabored ventilation, respiratory function stable and patient connected to nasal cannula oxygen Cardiovascular status: blood pressure returned to baseline and stable Postop Assessment: no apparent nausea or vomiting Anesthetic complications: no   No complications documented.   Last Vitals:  Vitals:   09/30/20 1206 09/30/20 1226  BP: 113/66 124/82  Pulse:    Resp:    Temp:    SpO2:      Last Pain:  Vitals:   09/30/20 1226  TempSrc:   PainSc: 0-No pain                 Precious Haws Tylen Leverich

## 2020-09-30 NOTE — Op Note (Signed)
[facility] Gastroenterology Patient Name: Shannon Page Procedure Date: 09/30/2020 11:20 AM MRN: [id number] Account #: [id number] Date of Birth: 1934/02/04 Admit Type: Outpatient Age: 87 Room: [facility] ENDO ROOM 3 Gender: Female Note Status: Finalized Procedure:             Upper GI endoscopy Indications:           Abnormal UGI series Providers:             Andrey Farmer MD, MD Referring MD:          Real Cons. Sharlet Salina MD, MD (Referring MD) Medicines:             Monitored Anesthesia Care Complications:         No immediate complications. Estimated blood loss:                         Minimal. Procedure:             Pre-Anesthesia Assessment:                        - Prior to the procedure, a History and Physical was                         performed, and patient medications and allergies were                         reviewed. The patient is competent. The risks and                         benefits of the procedure and the sedation options and                         risks were discussed with the patient. All questions                         were answered and informed consent was obtained.                         Patient identification and proposed procedure were                         verified by the physician, the nurse, the anesthetist                         and the technician in the endoscopy suite. Mental                         Status Examination: alert and oriented. Airway                         Examination: normal oropharyngeal airway and neck                         mobility. Respiratory Examination: clear to                         auscultation. CV Examination: normal. Prophylactic  Antibiotics: The patient does not require prophylactic                         antibiotics. Prior Anticoagulants: The patient has                         taken no previous anticoagulant or antiplatelet                         agents. ASA Grade  Assessment: II - A patient with mild                         systemic disease. After reviewing the risks and                         benefits, the patient was deemed in satisfactory                         condition to undergo the procedure. The anesthesia                         plan was to use monitored anesthesia care (MAC).                         Immediately prior to administration of medications,                         the patient was re-assessed for adequacy to receive                         sedatives. The heart rate, respiratory rate, oxygen                         saturations, blood pressure, adequacy of pulmonary                         ventilation, and response to care were monitored                         throughout the procedure. The physical status of the                         patient was re-assessed after the procedure.                        After obtaining informed consent, the endoscope was                         passed under direct vision. Throughout the procedure,                         the patient's blood pressure, pulse, and oxygen                         saturations were monitored continuously. The Endoscope                         was introduced through the mouth, and advanced to the  second part of duodenum. The upper GI endoscopy was                         accomplished without difficulty. The patient tolerated                         the procedure well. Findings:      White nummular lesions were noted in the lower third of the esophagus.       Brushings for KOH prep were obtained in the lower third of the esophagus.      No endoscopic abnormality was evident in the esophagus to explain the       patient's complaint of dysphagia. It was decided, however, to proceed       with dilation of the middle third of the esophagus and of the lower       third of the esophagus. A TTS dilator was passed through the scope.       Dilation with a  15-16.5-18 mm balloon dilator was performed to 18 mm.       The dilation site was examined and showed no change.      Diffuse mild inflammation characterized by erythema was found in the       gastric antrum. Biopsies were taken with a cold forceps for Helicobacter       pylori testing. Estimated blood loss was minimal.      The examined duodenum was normal. Impression:            - White nummular lesions in esophageal mucosa.                         Brushings performed.                        - No endoscopic esophageal abnormality to explain                         patient's dysphagia. Esophagus dilated. Dilated.                        - Gastritis. Biopsied.                        - Normal examined duodenum. Recommendation:        - Discharge patient to home.                        - Resume previous diet.                        - Continue present medications.                        - Await pathology results.                        - Return to referring physician as previously                         scheduled.                        - Perform ambulatory esophageal manometry if symptoms  persist. Procedure Code(s):     --- Professional ---                        [phone], Esophagogastroduodenoscopy, flexible,                         transoral; with transendoscopic balloon dilation of                         esophagus (less than 30 mm diameter) Diagnosis Code(s):     --- Professional ---                        K22.8, Other specified diseases of esophagus                        R13.10, Dysphagia, unspecified                        K29.70, Gastritis, unspecified, without bleeding                        R93.3, Abnormal findings on diagnostic imaging of                         other parts of digestive tract CPT copyright 2019 American Medical Association. All rights reserved. The codes documented in this report are preliminary and upon coder review may  be revised to  meet current compliance requirements. Andrey Farmer MD, MD 09/30/2020 11:59:11 AM Number of Addenda: 0 Note Initiated On: 09/30/2020 11:20 AM Estimated Blood Loss:  Estimated blood loss was minimal.      [facility]

## 2020-09-30 NOTE — Transfer of Care (Signed)
Immediate Anesthesia Transfer of Care Note  Patient: Shannon Page  Procedure(s) Performed: ESOPHAGOGASTRODUODENOSCOPY (EGD) WITH PROPOFOL (N/A )  Patient Location: PACU  Anesthesia Type:General  Level of Consciousness: awake, alert  and oriented  Airway & Oxygen Therapy: Patient Spontanous Breathing and Patient connected to nasal cannula oxygen  Post-op Assessment: Report given to RN and Post -op Vital signs reviewed and stable  Post vital signs: Reviewed and stable  Last Vitals:  Vitals Value Taken Time  BP 96/69 09/30/20 1156  Temp    Pulse 72 09/30/20 1157  Resp 19 09/30/20 1157  SpO2 92 % 09/30/20 1157  Vitals shown include unvalidated device data.  Last Pain:  Vitals:   09/30/20 1019  TempSrc: Temporal  PainSc: 0-No pain         Complications: No complications documented.

## 2020-09-30 NOTE — H&P (Signed)
Outpatient short stay form Pre-procedure 09/30/2020 11:36 AM Shannon Miyamoto MD, MPH  Primary Physician: Dr. Sharlet Salina  Reason for visit:  Abnormal Imaging  History of present illness:   87 y/o lady with chronic dysphagia that has worsened over the past few months. Had EGD in 2020 that was normal with normal biopsies but recently had barium swallow that suggested stricture as 13 mm would not past. Patient states she has trouble with both liquids and solids.    Current Facility-Administered Medications:  .  0.9 %  sodium chloride infusion, , Intravenous, Continuous, Rontavious Albright, Hilton Cork, MD, Last Rate: 20 mL/hr at 09/30/20 1040, New Bag at 09/30/20 1040  Medications Prior to Admission  Medication Sig Dispense Refill Last Dose  . aspirin EC 81 MG tablet Take by mouth.   Past Week at Unknown time  . Calcium-Vitamin D-Vitamin K (VIACTIV PO) Take by mouth.   Past Week at Unknown time  . furosemide (LASIX) 20 MG tablet Take 20 mg by mouth daily.   09/29/2020 at 0800  . OLANZapine (ZYPREXA) 7.5 MG tablet Take 7.5 mg by mouth at bedtime.  3 09/29/2020 at 2200  . Omega-3 Fatty Acids (FISH OIL PO) Take 500 mg by mouth 2 (two) times daily.    09/29/2020 at 080  . albuterol (VENTOLIN HFA) 108 (90 Base) MCG/ACT inhaler Inhale into the lungs. (Patient not taking: Reported on 09/30/2020)   Not Taking at Unknown time  . hydrocortisone 2.5 % cream APPLY TO AFFECTED AREA ON THE SKIN TWICE A DAY TO FACE UNTIL CLEAR, THEN AS NEEDED FLARES (Patient not taking: Reported on 09/30/2020)  1 Not Taking at Unknown time  . omeprazole (PRILOSEC) 20 MG capsule Take 20 mg by mouth daily. (Patient not taking: Reported on 09/30/2020)   Not Taking at Unknown time  . triamcinolone (KENALOG) 0.1 % Apply 1 application topically 2 (two) times daily. (Patient not taking: Reported on 09/30/2020)   Not Taking at Unknown time     Allergies  Allergen Reactions  . Doxycycline Other (See Comments)  . Moxifloxacin Other (See Comments)     tendonitis  . Atenolol     Dizziness   . Avelox [Moxifloxacin Hcl In Nacl]   . Clindamycin Rash  . Clindamycin/Lincomycin Rash  . Latex Rash  . Nystatin Rash  . Penicillins Rash     Past Medical History:  Diagnosis Date  . Anxiety   . 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 Chicken pox   . Hay fever   . History of frequent urinary tract infections   . Hypertension   . Idiopathic scoliosis    and kyphoscoliosis  . Insomnia   . Osteopenia   . Pulmonary embolism ([place])   . Thyroid disease   . Transient disorder of initiating or maintaining sleep     Review of systems:  Otherwise negative.    Physical Exam  Gen: Alert, oriented. Appears stated age.  HEENT: PERRLA. Lungs: No respiratory distress CV: RRR Abd: soft, benign, no masses Ext: No edema    Planned procedures: Proceed with EGD. The patient understands the nature of the planned procedure, indications, risks, alternatives and potential complications including but not limited to bleeding, infection, perforation, damage to internal organs and possible oversedation/side effects from anesthesia. The patient agrees and gives consent to proceed.  Please refer to procedure notes for findings, recommendations and patient disposition/instructions.     Shannon Miyamoto MD, MPH Gastroenterology 09/30/2020  11:36 AM

## 2020-09-30 NOTE — Interval H&P Note (Signed)
History and Physical Interval Note:  09/30/2020 11:39 AM  Shannon Page  has presented today for surgery, with the diagnosis of DYSPHAGIA.  The various methods of treatment have been discussed with the patient and family. After consideration of risks, benefits and other options for treatment, the patient has consented to  Procedure(s) with comments: ESOPHAGOGASTRODUODENOSCOPY (EGD) WITH PROPOFOL (N/A) - PREFERS AFTERNOON as a surgical intervention.  The patient's history has been reviewed, patient examined, no change in status, stable for surgery.  I have reviewed the patient's chart and labs.  Questions were answered to the patient's satisfaction.     Lesly Rubenstein  Ok to proceed with EGD

## 2020-09-30 NOTE — Anesthesia Preprocedure Evaluation (Signed)
Anesthesia Evaluation  Patient identified by MRN, date of birth, ID band Patient awake    Reviewed: Allergy & Precautions, H&P , NPO status , Patient's Chart, lab work & pertinent test results  History of Anesthesia Complications Negative for: history of anesthetic complications  Airway Mallampati: III  TM Distance: <3 FB Neck ROM: limited    Dental  (+) Chipped   Pulmonary neg pulmonary ROS, neg COPD, former smoker,    Pulmonary exam normal        Cardiovascular Exercise Tolerance: Good hypertension, Normal cardiovascular exam     Neuro/Psych PSYCHIATRIC DISORDERS negative neurological ROS  negative psych ROS   GI/Hepatic Neg liver ROS, hiatal hernia, GERD  Medicated and Controlled,  Endo/Other  negative endocrine ROS  Renal/GU negative Renal ROS  negative genitourinary   Musculoskeletal   Abdominal   Peds  Hematology negative hematology ROS (+)   Anesthesia Other Findings Past Medical History: No date: Anxiety No date: Aseptic necrosis of head of humerus ([place]) No date: Brief reactive psychosis (HCC) No date: Chicken pox No date: Hay fever No date: History of frequent urinary tract infections No date: Hypertension No date: Idiopathic scoliosis     Comment:  and kyphoscoliosis No date: Insomnia No date: Osteopenia No date: Pulmonary embolism (HCC) No date: Thyroid disease No date: Transient disorder of initiating or maintaining sleep  Past Surgical History: No date: amputation 2nd toe No date: APPENDECTOMY 08/01/2018: ESOPHAGOGASTRODUODENOSCOPY (EGD) WITH PROPOFOL; N/A     Comment:  Procedure: ESOPHAGOGASTRODUODENOSCOPY (EGD) WITH               PROPOFOL;  Surgeon: Virgel Manifold, MD;  Location:               [facility] ENDOSCOPY;  Service: Endoscopy;  Laterality: N/A; No date: REPAIR ZENKER'S DIVERTICULA No date: TONSILLECTOMY AND ADENOIDECTOMY No date: TUBAL LIGATION  BMI    Body Mass Index: 26.21  kg/m      Reproductive/Obstetrics negative OB ROS                             Anesthesia Physical Anesthesia Plan  ASA: III  Anesthesia Plan: General   Post-op Pain Management:    Induction: Intravenous  PONV Risk Score and Plan: Propofol infusion and TIVA  Airway Management Planned: Natural Airway and Nasal Cannula  Additional Equipment:   Intra-op Plan:   Post-operative Plan:   Informed Consent: I have reviewed the patients History and Physical, chart, labs and discussed the procedure including the risks, benefits and alternatives for the proposed anesthesia with the patient or authorized representative who has indicated his/her understanding and acceptance.     Dental Advisory Given  Plan Discussed with: Anesthesiologist, CRNA and Surgeon  Anesthesia Plan Comments: (Patient consented for risks of anesthesia including but not limited to:  - adverse reactions to medications - risk of airway placement if required - damage to eyes, teeth, lips or other oral mucosa - nerve damage due to positioning  - sore throat or hoarseness - Damage to heart, brain, nerves, lungs, other parts of body or loss of life  Patient voiced understanding.)        Anesthesia Quick Evaluation

## 2020-10-01 ENCOUNTER — Encounter: Payer: Self-pay | Admitting: Gastroenterology

## 2020-10-02 LAB — SURGICAL PATHOLOGY

## 2020-10-17 ENCOUNTER — Ambulatory Visit (INDEPENDENT_AMBULATORY_CARE_PROVIDER_SITE_OTHER): Payer: Medicare Other | Admitting: Podiatry

## 2020-10-17 ENCOUNTER — Telehealth (INDEPENDENT_AMBULATORY_CARE_PROVIDER_SITE_OTHER): Payer: Self-pay

## 2020-10-17 ENCOUNTER — Encounter: Payer: Self-pay | Admitting: Podiatry

## 2020-10-17 ENCOUNTER — Other Ambulatory Visit: Payer: Self-pay

## 2020-10-17 DIAGNOSIS — D2371 Other benign neoplasm of skin of right lower limb, including hip: Secondary | ICD-10-CM

## 2020-10-17 DIAGNOSIS — D2372 Other benign neoplasm of skin of left lower limb, including hip: Secondary | ICD-10-CM

## 2020-10-17 NOTE — Telephone Encounter (Signed)
This was received from Kern Alberta at [place]:  HI Mickel Baas, [place] all is well. I wanted to give you an update on patient MRN [id number]. We spoke with her today and let her know we are ready to order her pump and ship it out. She said she just wanted to talk with doctor about her situation to make sure that this was the best option for her. I let her know the doctor had sent the order and signed forms for her to get the pump and begin using. I also let her know it had been approved by insurance. She said she didn't make any decisions and that she wanted to talk with doctor. Just wanting to make you aware. Thank you.   Lavon Paganini

## 2020-10-18 ENCOUNTER — Encounter: Payer: Self-pay | Admitting: Podiatry

## 2020-10-18 NOTE — Progress Notes (Signed)
She presents today chief complaint of painfully elongated toenails and calluses bilaterally.  Objective: Pulses remain palpable toenails are long thick yellow dystrophic Lee mycotic painful palpation with reactive hyperkeratotic lesions plantarly.  Assessment: Pain in limb secondary to onychomycosis and porokeratosis.  Plan: Debridement of all reactive hyperkeratotic tissue without debridement of toenails 1 through 5 bilateral

## 2020-11-13 ENCOUNTER — Ambulatory Visit: Payer: Medicare Other | Admitting: Podiatry

## 2020-11-25 ENCOUNTER — Other Ambulatory Visit: Payer: Self-pay | Admitting: Internal Medicine

## 2020-11-25 DIAGNOSIS — Z1231 Encounter for screening mammogram for malignant neoplasm of breast: Secondary | ICD-10-CM

## 2020-11-27 ENCOUNTER — Ambulatory Visit
Admission: RE | Admit: 2020-11-27 | Discharge: 2020-11-27 | Disposition: A | Discharge status: Home / Self Care | Payer: Medicare Other | Source: Ambulatory Visit | Attending: Internal Medicine | Admitting: Internal Medicine

## 2020-11-27 ENCOUNTER — Other Ambulatory Visit: Payer: Self-pay

## 2020-11-27 DIAGNOSIS — Z1231 Encounter for screening mammogram for malignant neoplasm of breast: Secondary | ICD-10-CM | POA: Diagnosis not present

## 2020-12-10 ENCOUNTER — Ambulatory Visit: Payer: Medicare Other | Admitting: Internal Medicine

## 2020-12-11 ENCOUNTER — Telehealth: Payer: Self-pay

## 2020-12-11 NOTE — Telephone Encounter (Signed)
AWV appoint scheduled for 07/26

## 2020-12-18 ENCOUNTER — Ambulatory Visit (INDEPENDENT_AMBULATORY_CARE_PROVIDER_SITE_OTHER): Payer: Medicare Other | Admitting: Podiatry

## 2020-12-18 ENCOUNTER — Encounter: Payer: Self-pay | Admitting: Podiatry

## 2020-12-18 ENCOUNTER — Other Ambulatory Visit: Payer: Self-pay

## 2020-12-18 DIAGNOSIS — D2372 Other benign neoplasm of skin of left lower limb, including hip: Secondary | ICD-10-CM | POA: Diagnosis not present

## 2020-12-18 DIAGNOSIS — D2371 Other benign neoplasm of skin of right lower limb, including hip: Secondary | ICD-10-CM | POA: Diagnosis not present

## 2020-12-18 NOTE — Progress Notes (Signed)
She presents today to have her calluses trimmed beneath the plantar aspect of the first metatarsal both feet.  Objective: Vital signs are stable alert oriented x3 pulses are Golden and palpable.  She has mild reactive hyperkeratosis plantar aspect of the first metatarsophalangeal joint no open lesions or wounds.  Assessment benign skin lesions bilateral subfirst.  Plan: Debridement of benign skin lesions.

## 2020-12-19 ENCOUNTER — Ambulatory Visit: Payer: Medicare Other | Admitting: Podiatry

## 2021-02-11 ENCOUNTER — Other Ambulatory Visit: Payer: Self-pay

## 2021-02-11 ENCOUNTER — Encounter: Payer: Self-pay | Admitting: Internal Medicine

## 2021-02-11 ENCOUNTER — Ambulatory Visit (INDEPENDENT_AMBULATORY_CARE_PROVIDER_SITE_OTHER): Payer: Medicare Other | Admitting: Internal Medicine

## 2021-02-11 ENCOUNTER — Ambulatory Visit (INDEPENDENT_AMBULATORY_CARE_PROVIDER_SITE_OTHER): Payer: Medicare Other

## 2021-02-11 VITALS — BP 130/80 | HR 85 | Temp 97.8°F | Ht 59.0 in | Wt 127.4 lb

## 2021-02-11 DIAGNOSIS — I872 Venous insufficiency (chronic) (peripheral): Secondary | ICD-10-CM | POA: Diagnosis not present

## 2021-02-11 DIAGNOSIS — R0609 Other forms of dyspnea: Secondary | ICD-10-CM

## 2021-02-11 DIAGNOSIS — R0600 Dyspnea, unspecified: Secondary | ICD-10-CM

## 2021-02-11 DIAGNOSIS — R1319 Other dysphagia: Secondary | ICD-10-CM

## 2021-02-11 DIAGNOSIS — R2681 Unsteadiness on feet: Secondary | ICD-10-CM

## 2021-02-11 DIAGNOSIS — R0982 Postnasal drip: Secondary | ICD-10-CM | POA: Diagnosis not present

## 2021-02-11 DIAGNOSIS — Z0000 Encounter for general adult medical examination without abnormal findings: Secondary | ICD-10-CM | POA: Diagnosis not present

## 2021-02-11 MED ORDER — FLUTICASONE PROPIONATE 50 MCG/ACT NA SUSP: 2.0000 | Freq: Every day | NASAL | 6 refills | Status: AC

## 2021-02-11 MED ORDER — FUROSEMIDE 20 MG PO TABS: 20.0000 mg | ORAL_TABLET | Freq: Every day | ORAL | 1 refills | Status: AC | PRN

## 2021-02-11 NOTE — Patient Instructions (Addendum)
I think it is okay to use the lasix as needed for fluid to see if it is helping at all.   We have sent in flonase to use 2 sprays in each nostril once a day to help with drainage.   We will get you in with physical therapy to work on balance.

## 2021-02-11 NOTE — Assessment & Plan Note (Signed)
Rx flonase. Counseled about etiology and triggers.

## 2021-02-11 NOTE — Progress Notes (Signed)
Subjective:   Shannon Page is a 87 y.o. female who presents for Medicare Annual (Subsequent) preventive examination.  Review of Systems     Cardiac Risk Factors include: advanced age (>38mn, >>29women);family history of premature cardiovascular disease     Objective:    Today's Vitals   02/11/21 1219 02/11/21 1236  BP:  130/80  Pulse:  85  Temp:  97.8 F (36.6 C)  SpO2:  91%  Weight:  127 lb 6.4 oz (57.8 kg)  Height:  '4\' 11"'$  (1.499 m)  PainSc: 8  8   PainLoc:  Back   Body mass index is 25.73 kg/m.  Advanced Directives 02/11/2021 09/30/2020 05/07/2020 02/28/2020 02/27/2020 08/01/2018 12/24/2014  Does Patient Have a Medical Advance Directive? Yes Yes Yes Yes Yes No No  Type of AParamedicof [place] will [place] will [place] will [place] will Living will - -  Does patient want to make changes to medical advance directive? No - Patient declined - No - Patient declined No - Patient declined - - -  Copy of [place]acin Chart? No - copy requested No - copy requested - - - - -  Would patient like information on creating a medical advance directive? - - - - No - Patient declined Yes (Inpatient - patient requests chaplain consult to create a medical advance directive);No - Patient declined No - patient declined information    Current Medications (verified) Outpatient Encounter Medications as of 02/11/2021  Medication Sig   aspirin EC 81 MG tablet Take by mouth.   Calcium-Vitamin D-Vitamin K (VIACTIV PO) Take by mouth.   furosemide (LASIX) 20 MG tablet Take 20 mg by mouth daily.   OLANZapine (ZYPREXA) 7.5 MG tablet Take 7.5 mg by mouth at bedtime.   Omega-3 Fatty Acids (FISH OIL PO) Take 500 mg by mouth 2 (two) times daily.    omeprazole (PRILOSEC) 20 MG capsule Take 20 mg by mouth daily.   albuterol (VENTOLIN HFA) 108 (90 Base) MCG/ACT inhaler Inhale  into the lungs. (Patient not taking: No sig reported)   fluconazole (DIFLUCAN) 200 MG tablet Take by mouth. (Patient not taking: Reported on 02/11/2021)   hydrocortisone 2.5 % cream APPLY TO AFFECTED AREA ON THE SKIN TWICE A DAY TO FACE UNTIL CLEAR, THEN AS NEEDED FLARES (Patient not taking: No sig reported)   triamcinolone (KENALOG) 0.1 % Apply 1 application topically 2 (two) times daily. (Patient not taking: No sig reported)   No facility-administered encounter medications on file as of 02/11/2021.    Allergies (verified) Doxycycline, Moxifloxacin, Atenolol, Avelox [moxifloxacin hcl in nacl], Clindamycin, Clindamycin/lincomycin, Latex, Nystatin, and Penicillins   History: 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HCC)    [place]    Chicken pox    Hay fever    History of frequent urinary tract infections    Hypertension    Idiopathic scoliosis    and kyphoscoliosis   Insomnia    Osteopenia    Pulmonary embolism (HCC)    Thyroid disease    Transient disorder of initiating or maintaining sleep    Past Surgical History:  Procedure Laterality Date   amputation 2nd toe     APPENDECTOMY     ESOPHAGOGASTRODUODENOSCOPY (EGD) WITH PROPOFOL N/A 08/01/2018   Procedure: ESOPHAGOGASTRODUODENOSCOPY (EGD) WITH PROPOFOL;  Surgeon: TVirgel Manifold MD;  [facility];  Service: Endoscopy;  Laterality: N/A;   ESOPHAGOGASTRODUODENOSCOPY (EGD) WITH PROPOFOL N/A 09/30/2020   Procedure: ESOPHAGOGASTRODUODENOSCOPY (EGD) WITH PROPOFOL;  Surgeon: Lesly Rubenstein, MD;  Location: [facility] ENDOSCOPY;  Service: Endoscopy;  Laterality: N/A;  PREFERS AFTERNOON   REPAIR ZENKER'S DIVERTICULA     TONSILLECTOMY AND ADENOIDECTOMY     TUBAL LIGATION     Family History  Problem Relation Age of Onset   Cancer Mother    Hypertension Mother    Alcohol abuse Father    Early death Father    Heart disease Brother    Hypertension  Maternal Grandfather    Breast cancer Neg Hx    Social History   Socioeconomic History   Marital status: Married    Spouse name: Iona Beard   Number of children: 2   Years of education: Not on file   Highest education level: Not on file  Occupational History   Not on file  Tobacco Use   Smoking status: Former    Packs/day: 1.00    Years: 10.00    Pack years: 10.00    Types: Cigarettes    Quit date: 11/16/1961    Years since quitting: 59.2   Smokeless tobacco: Never  Vaping Use   Vaping Use: Never used  Substance and Sexual Activity   Alcohol use: Yes    Alcohol/week: 4.0 standard drinks    Types: 4 Cans of beer per week   Drug use: No   Sexual activity: Never  Other Topics Concern   Not on file  Social History Narrative   Not on file   Social Determinants of Health   Financial Resource Strain: Low Risk    Difficulty of Paying Living Expenses: Not hard at all  Food Insecurity: No Food Insecurity   Worried About Charity fundraiser in the Last Year: Never true   [place] in the Last Year: Never true  Transportation Needs: No Transportation Needs   Lack of Transportation (Medical): No   Lack of Transportation (Non-Medical): No  Physical Activity: Sufficiently Active   Days of Exercise per Week: 5 days   Minutes of Exercise per Session: 30 min  Stress: No Stress Concern Present   Feeling of Stress : Not at all  Social Connections: Socially Integrated   Frequency of Communication with Friends and Family: More than three times a week   Frequency of Social Gatherings with Friends and Family: More than three times a week   Attends Religious Services: More than 4 times per year   Active Member of [employer] or Organizations: Yes   Attends Music therapist: More than 4 times per year   Marital Status: Married    Tobacco Counseling Counseling given: Not Answered   Clinical Intake:  Pre-visit preparation completed: Yes  Pain : 0-10 Pain Score: 8   Pain Type: Acute pain Pain Location: Back Pain Orientation: Mid, Left Pain Radiating Towards: none Pain Descriptors / Indicators: Discomfort, Dull Pain Onset: 1 to 4 weeks ago Pain Frequency: Intermittent     BMI - recorded: 25.73 Nutritional Risks: None Diabetes: No  How often do you need to have someone help you when you read instructions, pamphlets, or other written materials from your doctor or pharmacy?: 1 - Never What is the last grade level you completed in school?: 18 years of education  Diabetic? no  Interpreter Needed?: No  Information entered by :: Lisette Abu, LPN   Activities of Daily Living In your present state  of health, do you have any difficulty performing the following activities: 02/11/2021 02/28/2020  Hearing? N N  Vision? N N  Difficulty concentrating or making decisions? N N  Walking or climbing stairs? Y N  Comment uses a cane -  Dressing or bathing? N N  Doing errands, shopping? N N  Preparing Food and eating ? N -  Using the Toilet? N -  In the past six months, have you accidently leaked urine? N -  Do you have problems with loss of bowel control? N -  Managing your Medications? N -  Managing your Finances? N -  Housekeeping or managing your Housekeeping? N -  Some recent data might be hidden    Patient Care Team: Hoyt Koch, MD as PCP - General (Internal Medicine) Ottie Glazier, MD as Consulting Physician (Pulmonary Disease) Leandrew Koyanagi, MD as Referring Physician (Ophthalmology) Conception Chancy, DDS as Referring Physician (Dentistry)  Indicate any recent Medical Services you may have received from other than [facility] providers in the past year (date may be approximate).     Assessment:   This is a routine wellness examination for [place].  Hearing/Vision screen No results found.  Dietary issues and exercise activities discussed: Current Exercise Habits: Home exercise routine, Time (Minutes): 30, Frequency  (Times/Week): 5, Weekly Exercise (Minutes/Week): 150, Intensity: Moderate, Exercise limited by: respiratory conditions(s)   Goals Addressed               This Visit's Progress     Patient Stated (pt-stated)        My goal is to spend more time being active but I would like to get in PT to help with unsteady balanace.      Depression Screen PHQ 2/9 Scores 02/11/2021 05/13/2020  PHQ - 2 Score 0 0  PHQ- 9 Score - 0    Fall Risk Fall Risk  02/11/2021 05/07/2020  Falls in the past year? 0 0  Number falls in past yr: 0 0  Injury with Fall? 0 0  Risk for fall due to : Impaired balance/gait Impaired balance/gait  Follow up Falls evaluation completed Falls evaluation completed;Education provided;Falls prevention discussed    FALL RISK PREVENTION PERTAINING TO THE HOME:  Any stairs in or around the home? Yes  If so, are there any without handrails? No  Home free of loose throw rugs in walkways, pet beds, electrical cords, etc? Yes  Adequate lighting in your home to reduce risk of falls? Yes   ASSISTIVE DEVICES UTILIZED TO PREVENT FALLS:  Life alert? Yes  Use of a cane, walker or w/c? Yes  Grab bars in the bathroom? Yes  Shower chair or bench in shower? No  Elevated toilet seat or a handicapped toilet? Yes   TIMED UP AND GO:  Was the test performed? Yes .  Length of time to ambulate 10 feet: 10 sec.   Gait steady and fast with assistive device  Cognitive Function: Normal cognitive status assessed by direct observation by this Nurse Health Advisor. No abnormalities found.          Immunizations Immunization History  Administered Date(s) Administered   Influenza, High Dose Seasonal PF 05/05/2017   Influenza-Unspecified 05/10/2012, 05/02/2013, 03/25/2014, 05/10/2015, 04/07/2016, 05/05/2017, 05/07/2017, 05/20/2020   Moderna Sars-Covid-2 Vaccination 09/01/2019, 09/29/2019, 07/02/2020   Pneumococcal Conjugate-13 01/12/2016   Pneumococcal Polysaccharide-23 12/10/2017    Tdap 11/17/2011, 12/24/2014    TDAP status: Up to date  Flu Vaccine status: Up to date  Pneumococcal vaccine status: Up  to date  Covid-19 vaccine status: Completed vaccines  Qualifies for Shingles Vaccine? Yes   Zostavax completed Yes   Shingrix Completed?: No.    Education has been provided regarding the importance of this vaccine. Patient has been advised to call insurance company to determine out of pocket expense if they have not yet received this vaccine. Advised may also receive vaccine at local pharmacy or Health Dept. Verbalized acceptance and understanding.  Screening Tests Health Maintenance  Topic Date Due   Zoster Vaccines- Shingrix (1 of 2) Never done   COVID-19 Vaccine (4 - Booster for Moderna series) 09/30/2020   INFLUENZA VACCINE  02/17/2021   TETANUS/TDAP  12/23/2024   DEXA SCAN  Completed   PNA vac Low Risk Adult  Completed   HPV VACCINES  Aged Out    Health Maintenance  Health Maintenance Due  Topic Date Due   Zoster Vaccines- Shingrix (1 of 2) Never done   COVID-19 Vaccine (4 - Booster for Moderna series) 09/30/2020    Colorectal cancer screening: No longer required.   Mammogram status: Completed 11/27/2020. Repeat every year  Bone Density status: Completed 10/16/2014. Results reflect: Bone density results: OSTEOPOROSIS. Repeat every 2 years.  Lung Cancer Screening: (Low Dose CT Chest recommended if Age 104-80 years, 30 pack-year currently smoking OR have quit w/in 15years.) does not qualify.   Lung Cancer Screening Referral: no  Additional Screening:  Hepatitis C Screening: does not qualify; Completed no  Vision Screening: Recommended annual ophthalmology exams for early detection of glaucoma and other disorders of the eye. Is the patient up to date with their annual eye exam?  Yes  Who is the provider or what is the name of the office in which the patient attends annual eye exams? [facility] If pt is not established with a provider, would  they like to be referred to a provider to establish care? No .   Dental Screening: Recommended annual dental exams for proper oral hygiene  Community Resource Referral / Chronic Care Management: CRR required this visit?  No   CCM required this visit?  No      Plan:     I have personally reviewed and noted the following in the patient's chart:   Medical and social history Use of alcohol, tobacco or illicit drugs  Current medications and supplements including opioid prescriptions.  Functional ability and status Nutritional status Physical activity Advanced directives List of other physicians Hospitalizations, surgeries, and ER visits in previous 12 months Vitals Screenings to include cognitive, depression, and falls Referrals and appointments  In addition, I have reviewed and discussed with patient certain preventive protocols, quality metrics, and best practice recommendations. A written personalized care plan for preventive services as well as general preventive health recommendations were provided to patient.     Sheral Flow, LPN   [id number]   Nurse Notes: n/a

## 2021-02-11 NOTE — Patient Instructions (Addendum)
[name] , Thank you for taking time to come for your Medicare Wellness Visit. I appreciate your ongoing commitment to your health goals. Please review the following plan we discussed and let me know if I can assist you in the future.   Screening recommendations/referrals: Colonoscopy: not a candidate due to age 87: 11/27/2020 Bone Density: last done 10/16/2014 Recommended yearly ophthalmology/optometry visit for glaucoma screening and checkup Recommended yearly dental visit for hygiene and checkup  Vaccinations: Influenza vaccine: 05/20/2020 Pneumococcal vaccine: 01/12/2016, 12/10/2017 Tdap vaccine: 12/24/2014; due every 10 years Shingles vaccine: completed per patient; need documentation Covid-19: 09/01/2019, 09/29/2019, 07/02/2020, 12/05/2020  Advanced directives: Please bring a copy of your health care power of attorney and living will to the office at your convenience.  Conditions/risks identified: Client understands the importance of follow-up with providers by attending scheduled visits and discussed goals to eat healthier, increase physical activity, exercise the brain, socialize more, get enough sleep and make time for laughter.  Next appointment: Please schedule your next Medicare Wellness Visit with your Nurse Health Advisor in 1 year by calling [phone].   Preventive Care 44 Years and Older, Female Preventive care refers to lifestyle choices and visits with your health care provider that can promote health and wellness. What does preventive care include? A yearly physical exam. This is also called an annual well check. Dental exams once or twice a year. Routine eye exams. Ask your health care provider how often you should have your eyes checked. Personal lifestyle choices, including: Daily care of your teeth and gums. Regular physical activity. Eating a healthy diet. Avoiding tobacco and drug use. Limiting alcohol use. Practicing safe sex. Taking low-dose aspirin  every day. Taking vitamin and mineral supplements as recommended by your health care provider. What happens during an annual well check? The services and screenings done by your health care provider during your annual well check will depend on your age, overall health, lifestyle risk factors, and family history of disease. Counseling  Your health care provider may ask you questions about your: Alcohol use. Tobacco use. Drug use. Emotional well-being. Home and relationship well-being. Sexual activity. Eating habits. History of falls. Memory and ability to understand (cognition). Work and work Statistician. Reproductive health. Screening  You may have the following tests or measurements: Height, weight, and BMI. Blood pressure. Lipid and cholesterol levels. These may be checked every 5 years, or more frequently if you are over 51 years old. Skin check. Lung cancer screening. You may have this screening every year starting at age 25 if you have a 30-pack-year history of smoking and currently smoke or have quit within the past 15 years. Fecal occult blood test (FOBT) of the stool. You may have this test every year starting at age 61. Flexible sigmoidoscopy or colonoscopy. You may have a sigmoidoscopy every 5 years or a colonoscopy every 10 years starting at age 55. Hepatitis C blood test. Hepatitis B blood test. Sexually transmitted disease (STD) testing. Diabetes screening. This is done by checking your blood sugar (glucose) after you have not eaten for a while (fasting). You may have this done every 1-3 years. Bone density scan. This is done to screen for osteoporosis. You may have this done starting at age 18. Mammogram. This may be done every 1-2 years. Talk to your health care provider about how often you should have regular mammograms. Talk with your health care provider about your test results, treatment options, and if necessary, the need for more tests. Vaccines  Your  health  care provider may recommend certain vaccines, such as: Influenza vaccine. This is recommended every year. Tetanus, diphtheria, and acellular pertussis (Tdap, Td) vaccine. You may need a Td booster every 10 years. Zoster vaccine. You may need this after age 46. Pneumococcal 13-valent conjugate (PCV13) vaccine. One dose is recommended after age 19. Pneumococcal polysaccharide (PPSV23) vaccine. One dose is recommended after age 23. Talk to your health care provider about which screenings and vaccines you need and how often you need them. This information is not intended to replace advice given to you by your health care provider. Make sure you discuss any questions you have with your health care provider. Document Released: 08/02/2015 Document Revised: 03/25/2016 Document Reviewed: 05/07/2015 Elsevier Interactive Patient Education  2017 [place] Prevention in the Home Falls can cause injuries. They can happen to people of all ages. There are many things you can do to make your home safe and to help prevent falls. What can I do on the outside of my home? Regularly fix the edges of walkways and driveways and fix any cracks. Remove anything that might make you trip as you walk through a door, such as a raised step or threshold. Trim any bushes or trees on the path to your home. Use bright outdoor lighting. Clear any walking paths of anything that might make someone trip, such as rocks or tools. Regularly check to see if handrails are loose or broken. Make sure that both sides of any steps have handrails. Any raised decks and porches should have guardrails on the edges. Have any leaves, snow, or ice cleared regularly. Use sand or salt on walking paths during winter. Clean up any spills in your garage right away. This includes oil or grease spills. What can I do in the bathroom? Use night lights. Install grab bars by the toilet and in the tub and shower. Do not use towel bars as grab  bars. Use non-skid mats or decals in the tub or shower. If you need to sit down in the shower, use a plastic, non-slip stool. Keep the floor dry. Clean up any water that spills on the floor as soon as it happens. Remove soap buildup in the tub or shower regularly. Attach bath mats securely with double-sided non-slip rug tape. Do not have throw rugs and other things on the floor that can make you trip. What can I do in the bedroom? Use night lights. Make sure that you have a light by your bed that is easy to reach. Do not use any sheets or blankets that are too big for your bed. They should not hang down onto the floor. Have a firm chair that has side arms. You can use this for support while you get dressed. Do not have throw rugs and other things on the floor that can make you trip. What can I do in the kitchen? Clean up any spills right away. Avoid walking on wet floors. Keep items that you use a lot in easy-to-reach places. If you need to reach something above you, use a Goings step stool that has a grab bar. Keep electrical cords out of the way. Do not use floor polish or wax that makes floors slippery. If you must use wax, use non-skid floor wax. Do not have throw rugs and other things on the floor that can make you trip. What can I do with my stairs? Do not leave any items on the stairs. Make sure that there are handrails  on both sides of the stairs and use them. Fix handrails that are broken or loose. Make sure that handrails are as long as the stairways. Check any carpeting to make sure that it is firmly attached to the stairs. Fix any carpet that is loose or worn. Avoid having throw rugs at the top or bottom of the stairs. If you do have throw rugs, attach them to the floor with carpet tape. Make sure that you have a light switch at the top of the stairs and the bottom of the stairs. If you do not have them, ask someone to add them for you. What else can I do to help prevent  falls? Wear shoes that: Do not have high heels. Have rubber bottoms. Are comfortable and fit you well. Are closed at the toe. Do not wear sandals. If you use a stepladder: Make sure that it is fully opened. Do not climb a closed stepladder. Make sure that both sides of the stepladder are locked into place. Ask someone to hold it for you, if possible. Clearly mark and make sure that you can see: Any grab bars or handrails. First and last steps. Where the edge of each step is. Use tools that help you move around (mobility aids) if they are needed. These include: Canes. Walkers. Scooters. Crutches. Turn on the lights when you go into a dark area. Replace any light bulbs as soon as they burn out. Set up your furniture so you have a clear path. Avoid moving your furniture around. If any of your floors are uneven, fix them. If there are any pets around you, be aware of where they are. Review your medicines with your doctor. Some medicines can make you feel dizzy. This can increase your chance of falling. Ask your doctor what other things that you can do to help prevent falls. This information is not intended to replace advice given to you by your health care provider. Make sure you discuss any questions you have with your health care provider. Document Released: 05/02/2009 Document Revised: 12/12/2015 Document Reviewed: 08/10/2014 Elsevier Interactive Patient Education  2017 [employer].

## 2021-02-11 NOTE — Assessment & Plan Note (Signed)
Using cane and she is interested in doing PT to help improve balance and make sure she is using cane properly.

## 2021-02-11 NOTE — Assessment & Plan Note (Signed)
Overall stable she is able to walk long distances on flat ground but is unable to do inclines well. No desaturations today.

## 2021-02-11 NOTE — Assessment & Plan Note (Signed)
EGD without blockage and she is still dealing with this and has to watch which foods and quantity at a time she is eating.

## 2021-02-11 NOTE — Progress Notes (Signed)
   Subjective:   Patient ID: Shannon Page, female    DOB: 04-07-1934, 87 y.o.   MRN: [id number]  HPI The patient is an 87 YO female coming in for follow up several conditions.   Review of Systems  Constitutional: Negative.   HENT:  Positive for congestion, postnasal drip and trouble swallowing.   Eyes: Negative.   Respiratory:  Negative for cough, chest tightness and shortness of breath.   Cardiovascular:  Negative for chest pain, palpitations and leg swelling.  Gastrointestinal:  Negative for abdominal distention, abdominal pain, constipation, diarrhea, nausea and vomiting.  Musculoskeletal: Negative.   Skin: Negative.   Neurological: Negative.   Psychiatric/Behavioral: Negative.     Objective:  Physical Exam Constitutional:      Appearance: She is well-developed.  HENT:     Head: Normocephalic and atraumatic.  Cardiovascular:     Rate and Rhythm: Normal rate and regular rhythm.  Pulmonary:     Effort: Pulmonary effort is normal. No respiratory distress.     Breath sounds: Normal breath sounds. No wheezing or rales.  Abdominal:     General: Bowel sounds are normal. There is no distension.     Palpations: Abdomen is soft.     Tenderness: There is no abdominal tenderness. There is no rebound.  Musculoskeletal:        General: Tenderness present.     Cervical back: Normal range of motion.  Skin:    General: Skin is warm and dry.  Neurological:     Mental Status: She is alert and oriented to person, place, and time.     Coordination: Coordination abnormal.     Comments: cane    Vitals:   02/11/21 1312  BP: 130/80  Pulse: 85  Temp: 97.8 F (36.6 C)  TempSrc: Oral  SpO2: 91%  Weight: 127 lb 6.4 oz (57.8 kg)  Height: '4\' 11"'$  (1.499 m)    This visit occurred during the SARS-CoV-2 public health emergency.  Safety protocols were in place, including screening questions prior to the visit, additional usage of staff PPE, and extensive cleaning of exam room while observing  appropriate contact time as indicated for disinfecting solutions.   Assessment & Plan:

## 2021-02-11 NOTE — Assessment & Plan Note (Signed)
Refill lasix 20 mg daily prn. Asked her to reduce to a few times per week and see if she notices a difference. If no difference can try stopping and using only if needed. She is using a pump from vascular which seems to be helping.

## 2021-02-17 ENCOUNTER — Ambulatory Visit: Payer: Medicare Other | Admitting: Podiatry

## 2021-03-03 ENCOUNTER — Other Ambulatory Visit: Payer: Self-pay

## 2021-03-03 ENCOUNTER — Ambulatory Visit (INDEPENDENT_AMBULATORY_CARE_PROVIDER_SITE_OTHER): Payer: Medicare Other | Admitting: Podiatry

## 2021-03-03 ENCOUNTER — Encounter: Payer: Self-pay | Admitting: Podiatry

## 2021-03-03 DIAGNOSIS — B351 Tinea unguium: Secondary | ICD-10-CM | POA: Diagnosis not present

## 2021-03-03 DIAGNOSIS — D2371 Other benign neoplasm of skin of right lower limb, including hip: Secondary | ICD-10-CM

## 2021-03-03 DIAGNOSIS — M79609 Pain in unspecified limb: Secondary | ICD-10-CM

## 2021-03-03 DIAGNOSIS — D2372 Other benign neoplasm of skin of left lower limb, including hip: Secondary | ICD-10-CM | POA: Diagnosis not present

## 2021-03-03 NOTE — Progress Notes (Signed)
She presents today chief complaint of painfully elongated toenails and calluses bilaterally.  Objective: Toenails are long thick yellow dystrophic Lee mycotic reactive hyper keratomas plantar aspect of the bilateral foot.  Assessment: Pain limb secondary to onychomycosis and benign skin lesions.  Plan: Debridement of benign skin lesions debridement of toenails 1 through 5 bilateral cover service secondary to pain.

## 2021-03-05 NOTE — Progress Notes (Signed)
MRN : [id number]  Shannon Page is a 87 y.o. (06-28-1934) female who presents with chief complaint of leg swelling.  History of Present Illness:   The patient returns to the office for followup evaluation regarding leg swelling.  The swelling has persisted but with the lymph pump is much, much better controlled. The pain associated with swelling is essentially eliminated. There have not been any interval development of a ulcerations or wounds.  The patient denies problems with the pump, noting it is working well and the leggings are in good condition.  Since the previous visit the patient has been wearing graduated compression stockings and using the lymph pump on a routine basis and  has noted significant improvement in the lymphedema.   Patient stated the lymph pump has been a very positive factor in her care.    No outpatient medications have been marked as taking for the 03/06/21 encounter (Appointment) with Delana Meyer, Dolores Lory, MD.    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     Past Surgical History:  Procedure Laterality Date   amputation 2nd toe     APPENDECTOMY     ESOPHAGOGASTRODUODENOSCOPY (EGD) WITH PROPOFOL N/A 08/01/2018   Procedure: ESOPHAGOGASTRODUODENOSCOPY (EGD) WITH PROPOFOL;  Surgeon: Virgel Manifold, MD;  Location: [facility] ENDOSCOPY;  Service: Endoscopy;  Laterality: N/A;   ESOPHAGOGASTRODUODENOSCOPY (EGD) WITH PROPOFOL N/A 09/30/2020   Procedure: ESOPHAGOGASTRODUODENOSCOPY (EGD) WITH PROPOFOL;  Surgeon: Lesly Rubenstein, MD;  Location: [facility] ENDOSCOPY;  Service: Endoscopy;  Laterality: N/A;  PREFERS  AFTERNOON   REPAIR ZENKER'S DIVERTICULA     TONSILLECTOMY AND ADENOIDECTOMY     TUBAL LIGATION      Social History Social History   Tobacco Use   Smoking status: Former    Packs/day: 1.00    Years: 10.00    Pack years: 10.00    Types: Cigarettes    Quit date: 11/16/1961    Years since quitting: 59.3   Smokeless tobacco: Never  Vaping Use   Vaping Use: Never used  Substance Use Topics   Alcohol use: Yes    Alcohol/week: 4.0 standard drinks    Types: 4 Cans of beer per week   Drug use: No    Family History Family History  Problem Relation Age of Onset   Cancer Mother    Hypertension Mother    Alcohol abuse Father    Early death Father    Heart disease Brother    Hypertension Maternal Grandfather    Breast cancer Neg Hx     Allergies  Allergen Reactions   Doxycycline Other (See Comments)   Moxifloxacin Other (See Comments)    tendonitis   Atenolol     Dizziness    Avelox [Moxifloxacin Hcl In Nacl]    Clindamycin Rash   Clindamycin/Lincomycin Rash   Latex Rash   Nystatin Rash   Penicillins Rash     REVIEW OF SYSTEMS (Negative unless checked)  Constitutional: '[]'$ Weight loss  '[]'$ Fever  '[]'$ Chills Cardiac: '[]'$ Chest pain   '[]'$ Chest pressure   '[]'$   Palpitations   '[]'$ Shortness of breath when laying flat   '[]'$ Shortness of breath with exertion. Vascular:  '[]'$ Pain in legs with walking   '[]'$ Pain in legs at rest  '[]'$ History of DVT   '[]'$ Phlebitis   '[x]'$ Swelling in legs   '[]'$ Varicose veins   '[]'$ Non-healing ulcers Pulmonary:   '[]'$ Uses home oxygen   '[]'$ Productive cough   '[]'$ Hemoptysis   '[]'$ Wheeze  '[]'$ COPD   '[]'$ Asthma Neurologic:  '[]'$ Dizziness   '[]'$ Seizures   '[]'$ History of stroke   '[]'$ History of TIA  '[]'$ Aphasia   '[]'$ Vissual changes   '[]'$ Weakness or numbness in arm   '[]'$ Weakness or numbness in leg Musculoskeletal:   '[]'$ Joint swelling   '[]'$ Joint pain   '[]'$ Low back pain Hematologic:  '[]'$ Easy bruising  '[]'$ Easy bleeding   '[]'$ Hypercoagulable state   '[]'$ Anemic Gastrointestinal:  '[]'$ Diarrhea   '[]'$ Vomiting   '[]'$ Gastroesophageal reflux/heartburn   '[]'$ Difficulty swallowing. Genitourinary:  '[]'$ Chronic kidney disease   '[]'$ Difficult urination  '[]'$ Frequent urination   '[]'$ Blood in urine Skin:  '[]'$ Rashes   '[]'$ Ulcers  Psychological:  '[]'$ History of anxiety   '[]'$  History of major depression.  Physical Examination  There were no vitals filed for this visit. There is no height or weight on file to calculate BMI. Gen: WD/WN, NAD Head: /AT, No temporalis wasting.  Ear/Nose/Throat: Hearing grossly intact, nares w/o erythema or drainage, pinna without lesions Eyes: PER, EOMI, sclera nonicteric.  Neck: Supple, no gross masses.  No JVD.  Pulmonary:  Good air movement, no audible wheezing, no use of accessory muscles.  Cardiac: RRR, precordium not hyperdynamic. Vascular:  scattered varicosities present bilaterally.  Mild venous stasis changes to the legs bilaterally.  2+ soft pitting edema  Vessel Right Left  Radial Palpable Palpable  Gastrointestinal: soft, non-distended. No guarding/no peritoneal signs.  Musculoskeletal: M/S 5/5 throughout.  No deformity.  Neurologic: CN 2-12 intact. Pain and light touch intact in extremities.  Symmetrical.  Speech is fluent. Motor exam as listed above. Psychiatric: Judgment intact, Mood & affect appropriate for pt's clinical situation. Dermatologic: Venous rashes no ulcers noted.  No changes consistent with cellulitis. Lymph : No lichenification or skin changes of chronic lymphedema.  CBC Lab Results  Component Value Date   WBC 8.1 08/12/2020   HGB 15.7 (H) 08/12/2020   HCT 48.5 (H) 08/12/2020   MCV 92.4 08/12/2020   PLT 214.0 08/12/2020    BMET    Component Value Date/Time   NA 138 08/12/2020 1141   K 4.6 08/12/2020 1141   CL 101 08/12/2020 1141   CO2 29 08/12/2020 1141   GLUCOSE 81 08/12/2020 1141   BUN 29 (H) 08/12/2020 1141   CREATININE 1.14 08/12/2020 1141   CALCIUM 10.1 08/12/2020 1141   GFRNONAA 36 (L) 02/29/2020 0444   GFRAA 42 (L) 02/29/2020 0444   CrCl  cannot be calculated (Patient's most recent lab result is older than the maximum 21 days allowed.).  COAG No results found for: INR, PROTIME  Radiology No results found.   Assessment/Plan 1. Lymphedema  No surgery or intervention at this point in time.    I have reviewed my discussion with the patient regarding lymphedema and why it  causes symptoms.  Patient will continue wearing graduated compression stockings class 1 (20-30 mmHg) on a daily basis a prescription was given. The patient is reminded to put the stockings on first thing in the morning and removing them in the evening. The patient is instructed specifically not to sleep in the stockings.   In addition, behavioral modification throughout the day will be  continued.  This will include frequent elevation (such as in a recliner), use of over the counter pain medications as needed and exercise such as walking.  I have reviewed systemic causes for chronic edema such as liver, kidney and cardiac etiologies and there does not appear to be any significant changes in these organ systems over the past year.  The patient is under the impression that these organ systems are all stable and unchanged.    The patient will continue aggressive use of the  lymph pump.  This will continue to improve the edema control and prevent sequela such as ulcers and infections.   The patient will follow-up with me on an annual basis.    2. Venous insufficiency, peripheral Well-controlled as patient follows conservative therapy as outlined above.    Kris Hartmann, NP   03/05/2021 8:52 PM

## 2021-03-06 ENCOUNTER — Encounter (INDEPENDENT_AMBULATORY_CARE_PROVIDER_SITE_OTHER): Payer: Self-pay | Admitting: Vascular Surgery

## 2021-03-06 ENCOUNTER — Other Ambulatory Visit: Payer: Self-pay

## 2021-03-06 ENCOUNTER — Ambulatory Visit (INDEPENDENT_AMBULATORY_CARE_PROVIDER_SITE_OTHER): Payer: Medicare Other | Admitting: Nurse Practitioner

## 2021-03-06 VITALS — BP 130/81 | HR 85 | Resp 15 | Ht 59.0 in | Wt 132.4 lb

## 2021-03-06 DIAGNOSIS — I890 Lymphedema, not elsewhere classified: Secondary | ICD-10-CM | POA: Diagnosis not present

## 2021-03-06 DIAGNOSIS — I872 Venous insufficiency (chronic) (peripheral): Secondary | ICD-10-CM

## 2021-03-12 ENCOUNTER — Ambulatory Visit (INDEPENDENT_AMBULATORY_CARE_PROVIDER_SITE_OTHER): Payer: Medicare Other | Admitting: Dermatology

## 2021-03-12 ENCOUNTER — Other Ambulatory Visit: Payer: Self-pay

## 2021-03-12 DIAGNOSIS — L578 Other skin changes due to chronic exposure to nonionizing radiation: Secondary | ICD-10-CM

## 2021-03-12 DIAGNOSIS — D0462 Carcinoma in situ of skin of left upper limb, including shoulder: Secondary | ICD-10-CM | POA: Diagnosis not present

## 2021-03-12 DIAGNOSIS — D485 Neoplasm of uncertain behavior of skin: Secondary | ICD-10-CM

## 2021-03-12 DIAGNOSIS — D099 Carcinoma in situ, unspecified: Secondary | ICD-10-CM

## 2021-03-12 HISTORY — DX: Carcinoma in situ, unspecified: D09.9

## 2021-03-12 NOTE — Progress Notes (Signed)
   Follow-Up Visit   Subjective  Shannon Page is a 87 y.o. female who presents for the following: Growth (Left shoulder x 2 months. No symptoms. Patient would like removed. ).   The following portions of the chart were reviewed this encounter and updated as appropriate:       Review of Systems:  No other skin or systemic complaints except as noted in HPI or Assessment and Plan.  Objective  Well appearing patient in no apparent distress; mood and affect are within normal limits.  A focused examination was performed including left shoulder. Relevant physical exam findings are noted in the Assessment and Plan.  Left medial shoulder 1.2 cm pink keratotic papule      Assessment & Plan  Neoplasm of uncertain behavior of skin Left medial shoulder  Skin / nail biopsy Type of biopsy: tangential   Informed consent: discussed and consent obtained   Patient was prepped and draped in usual sterile fashion: Area prepped with alcohol. Anesthesia: the lesion was anesthetized in a standard fashion   Anesthetic:  1% lidocaine w/ epinephrine 1-100,000 buffered w/ 8.4% NaHCO3 Instrument used: flexible razor blade   Hemostasis achieved with: pressure, aluminum chloride and electrodesiccation   Outcome: patient tolerated procedure well    Destruction of lesion  Destruction method: electrodesiccation and curettage   Informed consent: discussed and consent obtained   Timeout:  patient name, date of birth, surgical site, and procedure verified Curettage performed in three different directions: Yes   Electrodesiccation performed over the curetted area: Yes   Lesion length (cm):  1.2 Lesion width (cm):  1.2 Margin per side (cm):  0 Final wound size (cm):  1.2 Hemostasis achieved with:  pressure, aluminum chloride and electrodesiccation Outcome: patient tolerated procedure well with no complications   Post-procedure details: wound care instructions given   Post-procedure details comment:   Ointment and bandage applied.  Specimen 1 - Surgical pathology Differential Diagnosis: Hypertrohpic AK r/o SCC Check Margins: No 1.2 cm pink keratotic papule EDC today  Actinic Damage - chronic, secondary to cumulative UV radiation exposure/sun exposure over time - diffuse scaly erythematous macules with underlying dyspigmentation - Recommend daily broad spectrum sunscreen SPF 30+ to sun-exposed areas, reapply every 2 hours as needed.  - Recommend staying in the shade or wearing long sleeves, sun glasses (UVA+UVB protection) and wide brim hats (4-inch brim around the entire circumference of the hat). - Call for new or changing lesions.   Return in about 6 months (around 09/12/2021) for f/u biopsy.  IJamesetta Orleans, CMA, am acting as scribe for Brendolyn Patty, MD .  Documentation: I have reviewed the above documentation for accuracy and completeness, and I agree with the above.  Brendolyn Patty MD

## 2021-03-12 NOTE — Patient Instructions (Addendum)
Wound Care Instructions  Cleanse wound gently with soap and water once a day then pat dry with clean gauze. Apply a thing coat of Petrolatum (petroleum jelly, "Vaseline") over the wound (unless you have an allergy to this). We recommend that you use a new, sterile tube of Vaseline. Do not pick or remove scabs. Do not remove the yellow or white "healing tissue" from the base of the wound.  Cover the wound with fresh, clean, nonstick gauze and secure with paper tape. You may use Band-Aids in place of gauze and tape if the would is small enough, but would recommend trimming much of the tape off as there is often too much. Sometimes Band-Aids can irritate the skin.  You should call the office for your biopsy report after 1 week if you have not already been contacted.  If you experience any problems, such as abnormal amounts of bleeding, swelling, significant bruising, significant pain, or evidence of infection, please call the office immediately.  FOR ADULT SURGERY PATIENTS: If you need something for pain relief you may take 1 extra strength Tylenol (acetaminophen) AND 2 Ibuprofen (200mg each) together every 4 hours as needed for pain. (do not take these if you are allergic to them or if you have a reason you should not take them.) Typically, you may only need pain medication for 1 to 3 days.   If you have any questions or concerns for your doctor, please call our main line at [phone] and press option 4 to reach your doctor's medical assistant. If no one answers, please leave a voicemail as directed and we will return your call as soon as possible. Messages left after 4 pm will be answered the following business day.   You may also send us a message via MyChart. We typically respond to MyChart messages within 1-2 business days.  For prescription refills, please ask your pharmacy to contact our office. Our fax number is [phone].  If you have an urgent issue when the clinic is closed that  cannot wait until the next business day, you can page your doctor at the number below.    Please note that while we do our best to be available for urgent issues outside of office hours, we are not available 24/7.   If you have an urgent issue and are unable to reach us, you may choose to seek medical care at your doctor's office, retail clinic, urgent care center, or emergency room.  If you have a medical emergency, please immediately call 911 or go to the emergency department.  Pager Numbers  - Dr. Kowalski: [phone]  - Dr. Moye: [phone]  - Dr. Stewart: [phone]  In the event of inclement weather, please call our main line at [phone] for an update on the status of any delays or closures.  Dermatology Medication Tips: Please keep the boxes that topical medications come in in order to help keep track of the instructions about where and how to use these. Pharmacies typically print the medication instructions only on the boxes and not directly on the medication tubes.   If your medication is too expensive, please contact our office at [phone] option 4 or send us a message through MyChart.   We are unable to tell what your co-pay for medications will be in advance as this is different depending on your insurance coverage. However, we may be able to find a substitute medication at lower cost or fill out paperwork to get insurance to cover a needed   medication.   If a prior authorization is required to get your medication covered by your insurance company, please allow us 1-2 business days to complete this process.  Drug prices often vary depending on where the prescription is filled and some pharmacies may offer cheaper prices.  The website www.goodrx.com contains coupons for medications through different pharmacies. The prices here do not account for what the cost may be with help from insurance (it may be cheaper with your insurance), but the website can give you the  price if you did not use any insurance.  - You can print the associated coupon and take it with your prescription to the pharmacy.  - You may also stop by our office during regular business hours and pick up a GoodRx coupon card.  - If you need your prescription sent electronically to a different pharmacy, notify our office through [place] MyChart or by phone at [phone] option 4.   

## 2021-03-17 ENCOUNTER — Telehealth: Payer: Self-pay

## 2021-03-17 ENCOUNTER — Encounter (INDEPENDENT_AMBULATORY_CARE_PROVIDER_SITE_OTHER): Payer: Self-pay | Admitting: Nurse Practitioner

## 2021-03-17 NOTE — Telephone Encounter (Signed)
Spoke with pt and informed her of results. She had no concerns.

## 2021-03-17 NOTE — Telephone Encounter (Signed)
Left message for patient to call for biopsy results. 

## 2021-03-17 NOTE — Telephone Encounter (Signed)
-----   Message from Brendolyn Patty, MD sent at 03/17/2021  8:21 AM EDT ----- SQUAMOUS CELL CARCINOMA IN SITU, ASSOCIATED WITH VERRUCA  SCCIS skin cancer and wart- already treated with EDC at time of biopsy    - please call patient

## 2021-04-07 ENCOUNTER — Emergency Department: Payer: Medicare Other

## 2021-04-07 ENCOUNTER — Inpatient Hospital Stay
Admission: EM | Admit: 2021-04-07 | Discharge: 2021-04-19 | DRG: 291 | Disposition: E | Payer: Medicare Other | Attending: Internal Medicine | Admitting: Internal Medicine

## 2021-04-07 DIAGNOSIS — F419 Anxiety disorder, unspecified: Secondary | ICD-10-CM | POA: Diagnosis present

## 2021-04-07 DIAGNOSIS — Z79899 Other long term (current) drug therapy: Secondary | ICD-10-CM | POA: Diagnosis not present

## 2021-04-07 DIAGNOSIS — I469 Cardiac arrest, cause unspecified: Secondary | ICD-10-CM | POA: Diagnosis not present

## 2021-04-07 DIAGNOSIS — Z86008 Personal history of in-situ neoplasm of other site: Secondary | ICD-10-CM

## 2021-04-07 DIAGNOSIS — Z8249 Family history of ischemic heart disease and other diseases of the circulatory system: Secondary | ICD-10-CM

## 2021-04-07 DIAGNOSIS — M4120 Other idiopathic scoliosis, site unspecified: Secondary | ICD-10-CM | POA: Diagnosis present

## 2021-04-07 DIAGNOSIS — Z9049 Acquired absence of other specified parts of digestive tract: Secondary | ICD-10-CM

## 2021-04-07 DIAGNOSIS — Z87891 Personal history of nicotine dependence: Secondary | ICD-10-CM

## 2021-04-07 DIAGNOSIS — M8580 Other specified disorders of bone density and structure, unspecified site: Secondary | ICD-10-CM | POA: Diagnosis present

## 2021-04-07 DIAGNOSIS — Z86711 Personal history of pulmonary embolism: Secondary | ICD-10-CM

## 2021-04-07 DIAGNOSIS — Z881 Allergy status to other antibiotic agents status: Secondary | ICD-10-CM

## 2021-04-07 DIAGNOSIS — Z7982 Long term (current) use of aspirin: Secondary | ICD-10-CM

## 2021-04-07 DIAGNOSIS — I130 Hypertensive heart and chronic kidney disease with heart failure and stage 1 through stage 4 chronic kidney disease, or unspecified chronic kidney disease: Principal | ICD-10-CM | POA: Diagnosis present

## 2021-04-07 DIAGNOSIS — J9601 Acute respiratory failure with hypoxia: Secondary | ICD-10-CM | POA: Diagnosis present

## 2021-04-07 DIAGNOSIS — Z91040 Latex allergy status: Secondary | ICD-10-CM

## 2021-04-07 DIAGNOSIS — R739 Hyperglycemia, unspecified: Secondary | ICD-10-CM | POA: Diagnosis present

## 2021-04-07 DIAGNOSIS — R188 Other ascites: Secondary | ICD-10-CM | POA: Diagnosis present

## 2021-04-07 DIAGNOSIS — G4700 Insomnia, unspecified: Secondary | ICD-10-CM | POA: Diagnosis present

## 2021-04-07 DIAGNOSIS — Z9851 Tubal ligation status: Secondary | ICD-10-CM | POA: Diagnosis not present

## 2021-04-07 DIAGNOSIS — Z66 Do not resuscitate: Secondary | ICD-10-CM | POA: Diagnosis present

## 2021-04-07 DIAGNOSIS — I5021 Acute systolic (congestive) heart failure: Secondary | ICD-10-CM | POA: Diagnosis present

## 2021-04-07 DIAGNOSIS — Z880 Allergy status to penicillin: Secondary | ICD-10-CM

## 2021-04-07 DIAGNOSIS — I509 Heart failure, unspecified: Secondary | ICD-10-CM | POA: Diagnosis not present

## 2021-04-07 DIAGNOSIS — I5020 Unspecified systolic (congestive) heart failure: Secondary | ICD-10-CM

## 2021-04-07 DIAGNOSIS — Z89429 Acquired absence of other toe(s), unspecified side: Secondary | ICD-10-CM

## 2021-04-07 DIAGNOSIS — N1832 Chronic kidney disease, stage 3b: Secondary | ICD-10-CM | POA: Diagnosis present

## 2021-04-07 DIAGNOSIS — Z888 Allergy status to other drugs, medicaments and biological substances status: Secondary | ICD-10-CM

## 2021-04-07 DIAGNOSIS — Z87440 Personal history of urinary (tract) infections: Secondary | ICD-10-CM

## 2021-04-07 LAB — TROPONIN I (HIGH SENSITIVITY)
Troponin I (High Sensitivity): 67 ng/L — ABNORMAL HIGH (ref <18)
Troponin I (High Sensitivity): 79 ng/L — ABNORMAL HIGH (ref <18)

## 2021-04-07 LAB — CBC WITH DIFFERENTIAL/PLATELET
Abs Immature Granulocytes: 0.02 10*3/uL (ref 0.00–0.07)
Basophils Absolute: 0 10*3/uL (ref 0.0–0.1)
Basophils Relative: 1 %
Eosinophils Absolute: 0.2 10*3/uL (ref 0.0–0.5)
Eosinophils Relative: 2 %
HCT: 46.7 % — ABNORMAL HIGH (ref 36.0–46.0)
Hemoglobin: 15.6 g/dL — ABNORMAL HIGH (ref 12.0–15.0)
Immature Granulocytes: 0 %
Lymphocytes Relative: 11 %
Lymphs Abs: 0.8 10*3/uL (ref 0.7–4.0)
MCH: 32.6 pg (ref 26.0–34.0)
MCHC: 33.4 g/dL (ref 30.0–36.0)
MCV: 97.5 fL (ref 80.0–100.0)
Monocytes Absolute: 0.7 10*3/uL (ref 0.1–1.0)
Monocytes Relative: 9 %
Neutro Abs: 6 10*3/uL (ref 1.7–7.7)
Neutrophils Relative %: 77 %
Platelets: 168 10*3/uL (ref 150–400)
RBC: 4.79 MIL/uL (ref 3.87–5.11)
RDW: 14.5 % (ref 11.5–15.5)
WBC: 7.8 10*3/uL (ref 4.0–10.5)
nRBC: 0 % (ref 0.0–0.2)

## 2021-04-07 LAB — URINALYSIS, COMPLETE (UACMP) WITH MICROSCOPIC
Bilirubin Urine: NEGATIVE
Glucose, UA: NEGATIVE mg/dL
Ketones, ur: NEGATIVE mg/dL
Leukocytes,Ua: NEGATIVE
Nitrite: NEGATIVE
Protein, ur: NEGATIVE mg/dL
Specific Gravity, Urine: 1.015 (ref 1.005–1.030)
pH: 5 (ref 5.0–8.0)

## 2021-04-07 LAB — BRAIN NATRIURETIC PEPTIDE: B Natriuretic Peptide: 1241.6 pg/mL — ABNORMAL HIGH (ref 0.0–100.0)

## 2021-04-07 LAB — COMPREHENSIVE METABOLIC PANEL
ALT: 46 U/L — ABNORMAL HIGH (ref 0–44)
AST: 55 U/L — ABNORMAL HIGH (ref 15–41)
Albumin: 3.6 g/dL (ref 3.5–5.0)
Alkaline Phosphatase: 102 U/L (ref 38–126)
Anion gap: 10 (ref 5–15)
BUN: 43 mg/dL — ABNORMAL HIGH (ref 8–23)
CO2: 23 mmol/L (ref 22–32)
Calcium: 8.8 mg/dL — ABNORMAL LOW (ref 8.9–10.3)
Chloride: 103 mmol/L (ref 98–111)
Creatinine, Ser: 1.61 mg/dL — ABNORMAL HIGH (ref 0.44–1.00)
GFR, Estimated: 31 mL/min — ABNORMAL LOW (ref >60)
Glucose, Bld: 164 mg/dL — ABNORMAL HIGH (ref 70–99)
Potassium: 4.5 mmol/L (ref 3.5–5.1)
Sodium: 136 mmol/L (ref 135–145)
Total Bilirubin: 1.2 mg/dL (ref 0.3–1.2)
Total Protein: 6.7 g/dL (ref 6.5–8.1)

## 2021-04-07 LAB — BLOOD GAS, VENOUS
Acid-base deficit: 2.5 mmol/L — ABNORMAL HIGH (ref 0.0–2.0)
Bicarbonate: 22.5 mmol/L (ref 20.0–28.0)
O2 Saturation: 88.8 %
Patient temperature: 37
pCO2, Ven: 39 mmHg — ABNORMAL LOW (ref 44.0–60.0)
pH, Ven: 7.37 (ref 7.250–7.430)
pO2, Ven: 58 mmHg — ABNORMAL HIGH (ref 32.0–45.0)

## 2021-04-07 LAB — CBG MONITORING, ED: Glucose-Capillary: 79 mg/dL (ref 70–99)

## 2021-04-07 LAB — D-DIMER, QUANTITATIVE: D-Dimer, Quant: 4.78 ug/mL-FEU — ABNORMAL HIGH (ref 0.00–0.50)

## 2021-04-07 MED ORDER — SODIUM BICARBONATE 8.4 % IV SOLN
INTRAVENOUS | Status: AC | PRN
  Administered 2021-04-07: 50 meq via INTRAVENOUS

## 2021-04-07 MED ORDER — METOPROLOL TARTRATE 5 MG/5ML IV SOLN: 2.5000 mg | INTRAVENOUS | Status: DC | PRN

## 2021-04-07 MED ORDER — INSULIN ASPART 100 UNIT/ML IJ SOLN: 0.0000 [IU] | Freq: Every day | INTRAMUSCULAR | Status: DC

## 2021-04-07 MED ORDER — DEXTROSE 50 % IV SOLN
INTRAVENOUS | Status: AC | PRN
  Administered 2021-04-07: 50 mL via INTRAVENOUS

## 2021-04-07 MED ORDER — POLYETHYLENE GLYCOL 3350 17 G PO PACK: 17.0000 g | PACK | Freq: Every day | ORAL | Status: DC | PRN

## 2021-04-07 MED ORDER — CALCIUM CHLORIDE 10 % IV SOLN
INTRAVENOUS | Status: AC | PRN
  Administered 2021-04-07: 1 g via INTRAVENOUS

## 2021-04-07 MED ORDER — CALCIUM-PHOSPHORUS-VITAMIN D 250-107-500 MG-MG-UNIT PO CHEW: CHEWABLE_TABLET | Freq: Every day | ORAL | Status: DC

## 2021-04-07 MED ORDER — INSULIN ASPART 100 UNIT/ML IJ SOLN: 0.0000 [IU] | Freq: Three times a day (TID) | INTRAMUSCULAR | Status: DC

## 2021-04-07 MED ORDER — METOPROLOL TARTRATE 25 MG PO TABS
12.5000 mg | ORAL_TABLET | Freq: Two times a day (BID) | ORAL | Status: DC
  Administered 2021-04-07: 12.5 mg via ORAL
  Filled 2021-04-07: qty 1

## 2021-04-07 MED ORDER — ASPIRIN EC 81 MG PO TBEC
81.0000 mg | DELAYED_RELEASE_TABLET | Freq: Every day | ORAL | Status: DC
  Administered 2021-04-07: 81 mg via ORAL
  Filled 2021-04-07: qty 1

## 2021-04-07 MED ORDER — MELATONIN 5 MG PO TABS: 2.5000 mg | ORAL_TABLET | Freq: Every evening | ORAL | Status: DC | PRN

## 2021-04-07 MED ORDER — EPINEPHRINE 1 MG/10ML IJ SOSY
PREFILLED_SYRINGE | INTRAMUSCULAR | Status: AC | PRN
  Administered 2021-04-07 (×2): 1 mg via INTRAVENOUS

## 2021-04-07 MED ORDER — CALCIUM CARBONATE-VITAMIN D 500-200 MG-UNIT PO TABS: 1.0000 | ORAL_TABLET | Freq: Every day | ORAL | Status: DC

## 2021-04-07 MED ORDER — OMEGA-3-ACID ETHYL ESTERS 1 G PO CAPS
1.0000 g | ORAL_CAPSULE | Freq: Every day | ORAL | Status: DC
  Administered 2021-04-07: 1 g via ORAL
  Filled 2021-04-07 (×2): qty 1

## 2021-04-07 MED ORDER — ONDANSETRON HCL 4 MG/2ML IJ SOLN: 4.0000 mg | Freq: Four times a day (QID) | INTRAMUSCULAR | Status: DC | PRN

## 2021-04-07 MED ORDER — FUROSEMIDE 10 MG/ML IJ SOLN
40.0000 mg | Freq: Once | INTRAMUSCULAR | Status: AC
  Administered 2021-04-07: 40 mg via INTRAVENOUS
  Filled 2021-04-07: qty 4

## 2021-04-07 MED ORDER — FLUTICASONE PROPIONATE 50 MCG/ACT NA SUSP
2.0000 | Freq: Every day | NASAL | Status: DC
  Filled 2021-04-07: qty 16

## 2021-04-07 MED ORDER — IOHEXOL 350 MG/ML SOLN
60.0000 mL | Freq: Once | INTRAVENOUS | Status: AC | PRN
  Administered 2021-04-07: 60 mL via INTRAVENOUS

## 2021-04-07 MED ORDER — ENOXAPARIN SODIUM 30 MG/0.3ML IJ SOSY: 30.0000 mg | PREFILLED_SYRINGE | Freq: Every day | INTRAMUSCULAR | Status: DC

## 2021-04-07 MED ORDER — ENOXAPARIN SODIUM 30 MG/0.3ML IJ SOSY
30.0000 mg | PREFILLED_SYRINGE | Freq: Once | INTRAMUSCULAR | Status: AC
  Administered 2021-04-07: 30 mg via SUBCUTANEOUS
  Filled 2021-04-07: qty 0.3

## 2021-04-07 MED ORDER — OLANZAPINE 5 MG PO TABS: 7.5000 mg | ORAL_TABLET | Freq: Every day | ORAL | Status: DC

## 2021-04-07 MED ORDER — ACETAMINOPHEN 325 MG PO TABS
650.0000 mg | ORAL_TABLET | Freq: Four times a day (QID) | ORAL | Status: DC | PRN
Start: 2021-04-07 — End: 2021-04-08

## 2021-04-08 MED FILL — Medication: Qty: 1 | Status: AC

## 2021-04-19 NOTE — ED Provider Notes (Signed)
[facility] Emergency Department Provider Note   ____________________________________________   Event Date/Time   First MD Initiated Contact with Patient 04/27/21 1204     (approximate)  I have reviewed the triage vital signs and the nursing notes.   HISTORY  Chief Complaint Shortness of Breath    HPI Shannon Page is a 87 y.o. female who reports shortness of breath with some tachycardia going on for the last day or 2.  She has a cough that is nonproductive.  She was taking Lasix for edema but stopped 3 days ago not sure why.  Not sure if her doctor took her off of it or she stopped it.  Patient is not running a fever is not having any other problems.  She is not having any chest pain pleuritic or otherwise.        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[place])    Brief reactive psychosis (HCC)    Chicken pox    Hay fever    History of frequent urinary tract infections    Hypertension    Idiopathic scoliosis    and kyphoscoliosis   Insomnia    Osteopenia    Pulmonary embolism (HCC)    Squamous cell carcinoma in situ 03/12/2021   left medial shoulder, EDC   Thyroid disease    Transient disorder of initiating or maintaining sleep     Patient Active Problem List   Diagnosis Date Noted   Acute CHF (congestive heart failure) ([place]) 04-27-2021   Post-nasal drip 02/11/2021   Gait instability 02/11/2021   Lymphedema 09/05/2020   Chronic respiratory failure with hypoxia ([place]) 02/28/2020   Dyspnea on exertion 02/28/2020   PAH (pulmonary artery hypertension) ([place]) - on CTA chest 02/28/2020   Other dysphagia    Hiatal hernia    Venous insufficiency, peripheral 05/17/2012   Osteopenia 01/04/2012    Past Surgical History:  Procedure Laterality Date   amputation 2nd toe     APPENDECTOMY     ESOPHAGOGASTRODUODENOSCOPY (EGD) WITH PROPOFOL N/A 08/01/2018   Procedure: ESOPHAGOGASTRODUODENOSCOPY (EGD)  WITH PROPOFOL;  Surgeon: Virgel Manifold, MD;  Location: [facility] ENDOSCOPY;  Service: Endoscopy;  Laterality: N/A;   ESOPHAGOGASTRODUODENOSCOPY (EGD) WITH PROPOFOL N/A 09/30/2020   Procedure: ESOPHAGOGASTRODUODENOSCOPY (EGD) WITH PROPOFOL;  Surgeon: Lesly Rubenstein, MD;  Location: [facility] ENDOSCOPY;  Service: Endoscopy;  Laterality: N/A;  PREFERS AFTERNOON   REPAIR ZENKER'S DIVERTICULA     TONSILLECTOMY AND ADENOIDECTOMY     TUBAL LIGATION      Prior to Admission medications   Medication Sig Start Date End Date Taking? Authorizing Provider  aspirin EC 81 MG tablet Take by mouth.    [provider]  Calcium-Phosphorus-Vitamin D (CITRACAL +D3 PO) Take 2 tablets by mouth daily.    [provider]  Calcium-Vitamin D-Vitamin K (VIACTIV PO) Take by mouth.    [provider]  fluticasone (FLONASE) 50 MCG/ACT nasal spray Place 2 sprays into both nostrils daily. 02/11/21   Hoyt Koch, MD  furosemide (LASIX) 20 MG tablet Take 1 tablet (20 mg total) by mouth daily as needed. 02/11/21   Hoyt Koch, MD  hydrocortisone 2.5 % cream APPLY TO AFFECTED AREA ON THE SKIN TWICE A DAY TO FACE UNTIL CLEAR, THEN AS NEEDED FLARES Patient not taking: No sig reported 12/31/17   [provider]  OLANZapine (ZYPREXA) 7.5 MG tablet Take 7.5 mg by mouth at bedtime.  11/04/17   [provider]  Omega-3 Fatty Acids (FISH OIL PO) Take 500 mg by mouth 2 (two) times daily.     [provider]  triamcinolone (KENALOG) 0.1 % Apply 1 application topically 2 (two) times daily. Patient not taking: No sig reported    [provider]    Allergies Doxycycline, Moxifloxacin, Atenolol, Avelox [moxifloxacin hcl in nacl], Clindamycin, Clindamycin/lincomycin, Latex, Nystatin, and Penicillins  Family History  Problem Relation Age of Onset   Cancer Mother    Hypertension Mother    Alcohol abuse Father    Early death Father    Heart disease Brother     Hypertension Maternal Grandfather    Breast cancer Neg Hx     Social History Social History   Tobacco Use   Smoking status: Former    Packs/day: 1.00    Years: 10.00    Pack years: 10.00    Types: Cigarettes    Quit date: 11/16/1961    Years since quitting: 59.4   Smokeless tobacco: Never  Vaping Use   Vaping Use: Never used  Substance Use Topics   Alcohol use: Yes    Alcohol/week: 4.0 standard drinks    Types: 4 Cans of beer per week   Drug use: No    Review of Systems  Constitutional: No fever/chills Eyes: No visual changes. ENT: No sore throat. Cardiovascular: Denies chest pain. Respiratory:  shortness of breath. Gastrointestinal: No abdominal pain.  No nausea, no vomiting.  No diarrhea.  No constipation. Genitourinary: Negative for dysuria. Musculoskeletal: Negative for back pain. Skin: Negative for rash. Neurological: Negative for headaches, focal weakness   ____________________________________________   PHYSICAL EXAM:  VITAL SIGNS: ED Triage Vitals  Enc Vitals Group     BP 2021-04-13 1158 111/90     Pulse Rate 2021/04/13 1158 (!) 146     Resp 04-13-21 1158 (!) 24     Temp April 13, 2021 1201 98.6 F (37 C)     Temp Source 2021-04-13 1201 Oral     SpO2 2021/04/13 1158 98 %     Weight April 13, 2021 1200 142 lb 8 oz (64.6 kg)     Height 04/13/21 1200 '4\' 11"'$  (1.499 m)     Head Circumference --      Peak Flow --      Pain Score Apr 13, 2021 1200 0     Pain Loc --      Pain Edu? --      Excl. in [place]? --     Constitutional: Alert and oriented. Well appearing and in no acute distress. Eyes: Conjunctivae are normal. PER Head: Atraumatic. Nose: No congestion/rhinnorhea. Mouth/Throat: Mucous membranes are moist.  Oropharynx non-erythematous. Neck: No stridor.   Cardiovascular: Rapid rate, regular rhythm. Grossly normal heart sounds.  Good peripheral circulation. Respiratory: Normal respiratory effort.  No retractions. Lungs scattered crackles in the bases Gastrointestinal:  Soft and nontender. No distention. No abdominal bruits. No CVA tenderness. Musculoskeletal: No lower extremity tenderness bilateral 1+ edema.   Neurologic:  Normal speech and language. No gross focal neurologic deficits are appreciated.  Skin: Intact no rashes  ____________________________________________   LABS (all labs ordered are listed, but only abnormal results are displayed)  Labs Reviewed  COMPREHENSIVE METABOLIC PANEL - Abnormal; Notable for the following components:      Result Value   Glucose, Bld 164 (*)    BUN 43 (*)    Creatinine, Ser 1.61 (*)    Calcium 8.8 (*)    AST 55 (*)  ALT 46 (*)    GFR, Estimated 31 (*)    All other components within normal limits  BRAIN NATRIURETIC PEPTIDE - Abnormal; Notable for the following components:   B Natriuretic Peptide 1,241.6 (*)    All other components within normal limits  CBC WITH DIFFERENTIAL/PLATELET - Abnormal; Notable for the following components:   Hemoglobin 15.6 (*)    HCT 46.7 (*)    All other components within normal limits  D-DIMER, QUANTITATIVE - Abnormal; Notable for the following components:   D-Dimer, Quant 4.78 (*)    All other components within normal limits  URINALYSIS, COMPLETE (UACMP) WITH MICROSCOPIC - Abnormal; Notable for the following components:   Hgb urine dipstick TRACE (*)    Bacteria, UA RARE (*)    All other components within normal limits  BLOOD GAS, VENOUS - Abnormal; Notable for the following components:   pCO2, Ven 39 (*)    pO2, Ven 58.0 (*)    Acid-base deficit 2.5 (*)    All other components within normal limits  TROPONIN I (HIGH SENSITIVITY) - Abnormal; Notable for the following components:   Troponin I (High Sensitivity) 67 (*)    All other components within normal limits  TROPONIN I (HIGH SENSITIVITY) - Abnormal; Notable for the following components:   Troponin I (High Sensitivity) 79 (*)    All other components within normal limits    ____________________________________________  EKG  EKG read interpreted by me shows sinus tachycardia rate of 145 rightward or extreme right axis no acute ST-T wave changes except for rate similar to EKG from last August. ____________________________________________  RADIOLOGY Gertha Calkin, personally viewed and evaluated these images (plain radiographs) as part of my medical decision making, as well as reviewing the written report by the radiologist.  ED MD interpretation:    Official radiology report(s): DG Chest Portable 1 View  Result Date: 19-Apr-2021 CLINICAL DATA:  Shortness of breath. EXAM: PORTABLE CHEST 1 VIEW COMPARISON:  02/27/2020. FINDINGS: Small left pleural effusion. Streaky left basilar opacities, similar. No confluent consolidation. No visible pneumothorax. Mild enlargement the cardiac silhouette. Degenerative changes of the spine with S shaped thoracolumbar curvature. IMPRESSION: 1. Small left pleural effusion. 2. Similar streaky left basilar opacities, most likely atelectasis/scar. 3. Cardiomegaly. Electronically Signed   By: Margaretha Sheffield M.D.   On: 04-19-21 13:47    ____________________________________________   PROCEDURES  Procedure(s) performed (including Critical Care):  Procedures   ____________________________________________   INITIAL IMPRESSION / ASSESSMENT AND PLAN / ED COURSE  Patient's chest x-ray may be somewhat representative of congestive failure.  Her heart is large.  D-dimer is elevated so we will have to get a CT scan.  I think we can get this in spite of her GFR being only 31.  Additionally we will have to get her in the hospital for the CHF.  She is hypoxic off of oxygen and she is not normally on oxygen.  Anticipate we will have to give her some Lasix.  Have to be very careful with this because her GFR has fallen somewhat since her previous blood test morning when it was 44.               ____________________________________________   FINAL CLINICAL IMPRESSION(S) / ED DIAGNOSES  Final diagnoses:  Systolic congestive heart failure, unspecified HF chronicity [facility])     ED Discharge Orders     None        Note:  This document was prepared using Dragon voice recognition  software and may include unintentional dictation errors.    Nena Polio, MD 04-20-21 1544

## 2021-04-19 NOTE — Code Documentation (Signed)
Bicarb given

## 2021-04-19 NOTE — Code Documentation (Signed)
Dr Jari Pigg on phone with husband Shannon Page

## 2021-04-19 NOTE — H&P (Addendum)
History and Physical  Shannon Page [id number] DOB: 1933-12-30 DOA: 04-08-21  Referring physician:  Dr. Cinda Quest, [place] PCP: Hoyt Koch, MD  Outpatient Specialists: None Patient coming from: Home  Chief Complaint: Dyspnea with minimal exertion   HPI: Shannon Page is a 87 y.o. female with medical history significant for bilateral lower extremity edema on Lasix (for 1 year, prescribed by PCP), history of pulmonary embolism 20 years ago was on Coumadin for 3 months, who presented to [facility] ED from independent living facility with complaints of gradually worsening shortness of breath of 5 days duration.  Associated with intermittent non productive cough and bilateral lower extremity edema, right greater than left.  She stopped taking her Lasix 3 days ago, she forgot to take it.  Denies chest pain.  Hypoxic on EMS arrival with O2 saturation 84% on room air.  She was placed on nonrebreather.  Upon presentation to the ED, she is tachycardic rates in the 140s.  Work-up in the ED revealed acute CHF with BNP greater than 1200, bilateral lower extremity edema R>L, cardiomegaly, left pleural effusion on chest x-ray, elevated D-dimer greater than 4 for which EDP ordered CTA chest to rule out PE.  She was given a dose of IV Lasix 40 mg x 1.  TRH, hospitalist team, was asked to admit.    ED Course: Temperature 98.6.  BP 110/86, pulse 139, respiration rate 29, O2 saturation 96% on 6L.  Lab studies significant for serum glucose 164, BUN 23, creatinine 1.61 from 1.14 (08/12/20) with GFR 31.  Elevated AST 55, ALT 46.  BNP 1241.  Troponin 79 from 67.  CBC essentially unremarkable.  Review of Systems: Review of systems as noted in the HPI. All other systems reviewed and are negative.   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   left medial shoulder, EDC   Thyroid disease    Transient disorder of initiating or maintaining sleep    Past Surgical History:  Procedure Laterality Date   amputation 2nd toe     APPENDECTOMY     ESOPHAGOGASTRODUODENOSCOPY (EGD) WITH PROPOFOL N/A 08/01/2018   Procedure: ESOPHAGOGASTRODUODENOSCOPY (EGD) WITH PROPOFOL;  Surgeon: Virgel Manifold, MD;  Location: [facility] ENDOSCOPY;  Service: Endoscopy;  Laterality: N/A;   ESOPHAGOGASTRODUODENOSCOPY (EGD) WITH PROPOFOL N/A 09/30/2020   Procedure: ESOPHAGOGASTRODUODENOSCOPY (EGD) WITH PROPOFOL;  Surgeon: Lesly Rubenstein, MD;  Location: [facility] ENDOSCOPY;  Service: Endoscopy;  Laterality: N/A;  PREFERS AFTERNOON   REPAIR ZENKER'S DIVERTICULA     TONSILLECTOMY AND ADENOIDECTOMY     TUBAL LIGATION      Social History:  reports that she quit smoking about 59 years ago. Her smoking use included cigarettes. She has a 10.00 pack-year smoking history. She has never used smokeless tobacco. She reports current alcohol use of about 4.0 standard drinks per week. She reports that she does not use drugs.   Allergies  Allergen Reactions   Doxycycline Other (See Comments)   Moxifloxacin Other (See Comments)    tendonitis   Atenolol     Dizziness    Avelox [  Moxifloxacin Hcl In Nacl]    Clindamycin Rash   Clindamycin/Lincomycin Rash   Latex Rash   Nystatin Rash   Penicillins Rash    Family History  Problem Relation Age of Onset   Cancer Mother    Hypertension Mother    Alcohol abuse Father    Early death Father    Heart disease Brother    Hypertension Maternal Grandfather    Breast cancer Neg Hx     \  Prior to Admission medications   Medication Sig Start Date End Date Taking? Authorizing Provider  aspirin EC 81 MG tablet Take by mouth.    [provider]   Calcium-Phosphorus-Vitamin D (CITRACAL +D3 PO) Take 2 tablets by mouth daily.    [provider]  Calcium-Vitamin D-Vitamin K (VIACTIV PO) Take by mouth.    [provider]  fluticasone (FLONASE) 50 MCG/ACT nasal spray Place 2 sprays into both nostrils daily. 02/11/21   Hoyt Koch, MD  furosemide (LASIX) 20 MG tablet Take 1 tablet (20 mg total) by mouth daily as needed. 02/11/21   Hoyt Koch, MD  hydrocortisone 2.5 % cream APPLY TO AFFECTED AREA ON THE SKIN TWICE A DAY TO FACE UNTIL CLEAR, THEN AS NEEDED FLARES Patient not taking: No sig reported 12/31/17   [provider]  OLANZapine (ZYPREXA) 7.5 MG tablet Take 7.5 mg by mouth at bedtime. 11/04/17   [provider]  Omega-3 Fatty Acids (FISH OIL PO) Take 500 mg by mouth 2 (two) times daily.     [provider]  triamcinolone (KENALOG) 0.1 % Apply 1 application topically 2 (two) times daily. Patient not taking: No sig reported    [provider]    Physical Exam: BP 110/86   Pulse (!) 139   Temp 98.6 F (37 C) (Oral)   Resp (!) 24   Ht '4\' 11"'$  (1.499 m)   Wt 64.6 kg   SpO2 96%   BMI 28.78 kg/m   General: 87 y.o. year-old female well developed well nourished in no acute distress.  Alert and oriented x3. Cardiovascular: Regular rate and rhythm with no rubs or gallops.  No thyromegaly or JVD noted.  1+ pitting edema in lower extremities bilaterally.  2/4 pulses in all 4 extremities. Respiratory: Mild rales at bases with no wheezing noted. Good inspiratory effort. Abdomen: Soft nontender nondistended with normal bowel sounds x4 quadrants. Muskuloskeletal: No cyanosis or clubbing.  1+ pitting edema in lower extremities bilaterally.   Neuro: CN II-XII intact, strength, sensation, reflexes Skin: No ulcerative lesions noted or rashes Psychiatry: Judgement and insight appear normal. Mood is appropriate for condition and setting          Labs on Admission:  Basic  Metabolic Panel: Recent Labs  Lab 04-23-21 1200  NA 136  K 4.5  CL 103  CO2 23  GLUCOSE 164*  BUN 43*  CREATININE 1.61*  CALCIUM 8.8*   Liver Function Tests: Recent Labs  Lab 04-23-2021 1200  AST 55*  ALT 46*  ALKPHOS 102  BILITOT 1.2  PROT 6.7  ALBUMIN 3.6   No results for input(s): LIPASE, AMYLASE in the last 168 hours. No results for input(s): AMMONIA in the last 168 hours. CBC: Recent Labs  Lab Apr 23, 2021 1200  WBC 7.8  NEUTROABS 6.0  HGB 15.6*  HCT 46.7*  MCV 97.5  PLT 168   Cardiac Enzymes: No results for input(s): CKTOTAL, CKMB, CKMBINDEX, TROPONINI in the last 168 hours.  BNP (last 3 results)  Recent Labs    14-Apr-2021 1200  BNP 1,241.6*    ProBNP (last 3 results) No results for input(s): PROBNP in the last 8760 hours.  CBG: No results for input(s): GLUCAP in the last 168 hours.  Radiological Exams on Admission: DG Chest Portable 1 View  Result Date: 2021/04/14 CLINICAL DATA:  Shortness of breath. EXAM: PORTABLE CHEST 1 VIEW COMPARISON:  02/27/2020. FINDINGS: Small left pleural effusion. Streaky left basilar opacities, similar. No confluent consolidation. No visible pneumothorax. Mild enlargement the cardiac silhouette. Degenerative changes of the spine with S shaped thoracolumbar curvature. IMPRESSION: 1. Small left pleural effusion. 2. Similar streaky left basilar opacities, most likely atelectasis/scar. 3. Cardiomegaly. Electronically Signed   By: Margaretha Sheffield M.D.   On: April 14, 2021 13:47    EKG: I independently viewed the EKG done and my findings are as followed: Wide QRS tachycardia rate of 140.  RBBB, nonspecific ST-T changes.  QTc 430.  Assessment/Plan Present on Admission: **None**  Active Problems:   Acute CHF (congestive heart failure) ([place])  Newly diagnosed acute CHF, unspecified. No prior history of echocardiogram Presented with dyspnea for few days duration, bilateral lower extremity edema, cardiomegaly, pulmonary edema on chest  x-ray, BNP greater than 1200. IV diuretics, IV Lasix 40 mg x 1 given in the ED. Holding off on further diuretics for now due to IV contrast given for CTA chest to rule out PE. 2D echo ordered, pending. Strict I's and O's and daily weight Cardiology consult  Tachycardia Initial twelve-lead EKG showed wide QRS tachycardia rate of 140 Repeat twelve-lead EKG Start p.o. Lopressor 12.5 mg twice daily IV Lopressor as needed with parameters Ordered TSH, follow results.  Elevated troponin, suspect demand ischemia in the setting of tachycardia and acute CHF Initial troponin 67, then 79 Trend troponin  Elevated D-dimer/history of pulmonary embolism 20 years ago was on Coumadin for 3 months. CTA chest ordered by EDP to rule out PE Follow CTA, no evidence of pulmonary embolism.  CT scan showed cardiomegaly with evidence of mild interstitial pulmonary edema in the lungs and small bilateral pleural effusions.  Dilatation of the pulmonic trunk 3.9 cm in diameter, right-sided heart dilatation and reflux of contrast material into the IVC, suggesting right-sided heart failure.  Small volume of ascites. She received a dose of IV Lasix in the ED prior to IV contrast  AKI on CKD 3B Baseline creatinine appears to be 1.1 with GFR 36 Presented with creatinine of 1.6 with GFR 31 Avoid nephrotoxic agents and hypotension Closely monitor urine output Received IV contrast for CTA chest Closely monitor renal function Holding off further diuresing for now due to recent IV contrast Repeat BMP in the morning  Acute hypoxic respiratory failure secondary to pulmonary edema, left pleural effusion, likely cardiogenic. Not on oxygen supplementation at baseline Currently on 6 L to maintain a saturation greater than 92%. Wean off oxygen supplementation as tolerated. Obtain home O2 evaluation on 04/08/2021. Incentive spirometer TOC consulted to assist with DME's if required, for DC planning  Elevated liver  chemistries, suspect hepatic congestion Monitor levels for now Repeat LFTs in the morning Overall hepatotoxic agents  Hyperglycemia, no prior history of diabetes Obtain A1c Insulin sliding scale.    DVT prophylaxis: Subcu Lovenox daily  Code Status: Full code  Family Communication: None at bedside.  Disposition Plan: Admitted to progressive cardiac unit  Consults called: Cardiology consulted via epic  Admission status: Inpatient status.  Patient will require at least 2 midnights for further evaluation and treatment of present  condition   Status is: Inpatient    Dispo:  Patient From:  Home  Planned Disposition:  Home  Medically stable for discharge:  No          Kayleen Memos MD [facility] Pager [phone]  If 7PM-7AM, please contact night-coverage [email or web address] Password [facility]  2021/04/18, 3:29 PM

## 2021-04-19 NOTE — Progress Notes (Addendum)
Received a call regarding the patient becoming unexpectedly unresponsive, requiring CPR with ACLS guidelines.  During admission visit, patient endorsed feeling better.  No DNR forms were noted in the room at the time of the visit.  Plan of care was discussed with the patient.  Around 1910 on 05/07/21, compressions were started as the patient became pulseless.  DNR form was noted in the room by nursing staff.  EDP contacted patient's husband who confirmed DNR and the code was stopped.  Patient deceased.  Time of death 62.

## 2021-04-19 NOTE — Progress Notes (Signed)
PHARMACIST - PHYSICIAN COMMUNICATION  CONCERNING:  Enoxaparin (Lovenox) for DVT Prophylaxis    RECOMMENDATION: Patient was prescribed enoxaprin '40mg'$  q24 hours for VTE prophylaxis.   Filed Weights   Apr 22, 2021 1200  Weight: 64.6 kg (142 lb 8 oz)    Body mass index is 28.78 kg/m.  Estimated Creatinine Clearance: 21.3 mL/min (A) (by C-G formula based on SCr of 1.61 mg/dL (H)).    Patient is candidate for enoxaparin '30mg'$  every 24 hours based on CrCl <79m/min or Weight <45kg  DESCRIPTION: [facility]  Patient is now receiving enoxaparin 30 mg every 24 hours    CDorothe Pea PharmD, BCPS Clinical Pharmacist  92022/10/043:24 PM

## 2021-04-19 NOTE — Code Documentation (Signed)
Compressions at [employer]

## 2021-04-19 NOTE — ED Triage Notes (Signed)
Pt arrives via EMS from [place] living with SOB and leg swelling. Pt denies having CHF but stopped taking her lasix 3 days ago. EMS reports 84% on RA, placed on NRB. Pt HR 145, denies irregular heart rhythm.

## 2021-04-19 NOTE — Plan of Care (Signed)
Called to bedside pt being coded. On arrival, pt pronounced dead by ED physician. Time of death 19:22. Family notified and on their way.

## 2021-04-19 NOTE — TOC Initial Note (Addendum)
Transition of Care [facility]) - Initial/Assessment Note    Patient Details  Name: Shannon Page MRN: [id number] Date of Birth: 1934-03-16  [facility] CM/SW Contact:    Anselm Pancoast, RN Phone Number: 2021/04/08, 2:26 PM  Clinical Narrative:                 Received call from [facility] [phone] states husband is en route to bring DNR paperwork. Requesting Fl2 in the event patient requires transfer to [facility] prior to returning to [place].         Patient Goals and CMS Choice        Expected Discharge Plan and Services                                                Prior Living Arrangements/Services                       Activities of Daily Living      Permission Sought/Granted                  Emotional Assessment              Admission diagnosis:  difficulty breathing, EMS Patient Active Problem List   Diagnosis Date Noted   Post-nasal drip 02/11/2021   Gait instability 02/11/2021   Lymphedema 09/05/2020   Chronic respiratory failure with hypoxia ([place]) 02/28/2020   Dyspnea on exertion 02/28/2020   PAH (pulmonary artery hypertension) ([place]) - on CTA chest 02/28/2020   Other dysphagia    Hiatal hernia    Venous insufficiency, peripheral 05/17/2012   Osteopenia 01/04/2012   PCP:  Hoyt Koch, MD Pharmacy:   [id number] [place] [place] [address] [place] [address] Phone: [phone] Fax: [phone]     Social Determinants of Health (SDOH) Interventions    Readmission Risk Interventions No flowsheet data found.

## 2021-04-19 NOTE — ED Notes (Signed)
Pt denies any pain at this time. Medicated with lasix and purwick placed. Pt requesting remote. No other needs at this time. Call bell in reach.

## 2021-04-19 NOTE — ED Notes (Addendum)
Pt reporting not feeling well and having shortness of breath. Pt increased on o2 temporarily to 7L [place]. Pt states she felt like she caught her breath. Pt thought it may have been her food.. Pt said that she was feeling fine and would call out if she needed anything else.

## 2021-04-19 NOTE — Code Documentation (Signed)
Compressions continued

## 2021-04-19 NOTE — Progress Notes (Signed)
   2021-04-24 2040  Clinical Encounter Type  Visited With Family;Health care provider  Visit Type Initial;Death  Referral From Physician;Nurse   This chaplain received a call from the ED requesting support for the patient's family (who were en route) in the wake of her sudden death. Upon my arrival, the patient's husband was receiving an update on his wife's care from nursing staff. He was accompanied by a neighbor/friend and he shared his heartbreak shock given the circumstances. The patient's husband shared that they had been married for 65 blessed years and that they had shared a good life together.   The patient and his neighbor/friend were escorted back to the patient's room, where they spent time at her bedside. Although this is understandably a difficult time, the patient's husband seems to be grieving appropriately.   The patient's spouse and neighbor were escorted to the ED lobby when they were ready to leave. No additional family or loved ones expected.  Gennaro Africa, Chaplain

## 2021-04-19 NOTE — ED Provider Notes (Signed)
[facility] Department of Emergency Medicine   Code Blue CONSULT NOTE  Chief Complaint: Cardiac arrest/unresponsive   Level V Caveat: Unresponsive  History of present illness: I was contacted by the hospital for a CODE BLUE cardiac arrest down in the ED.    ROS: Unable to obtain, Level V caveat  Scheduled Meds:  aspirin EC  81 mg Oral Daily   [START ON 04/08/2021] calcium-vitamin D  1 tablet Oral Q breakfast   [START ON 04/08/2021] fluticasone  2 spray Each Nare Daily   insulin aspart  0-5 Units Subcutaneous QHS   insulin aspart  0-9 Units Subcutaneous TID WC   metoprolol tartrate  12.5 mg Oral BID   OLANZapine  7.5 mg Oral QHS   omega-3 acid ethyl esters  1 g Oral Daily   Continuous Infusions: PRN Meds:.acetaminophen, melatonin, metoprolol tartrate, ondansetron (ZOFRAN) IV, polyethylene glycol 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HCC)    Brief reactive psychosis (HCC)    Chicken pox    Hay fever    History of frequent urinary tract infections    Hypertension    Idiopathic scoliosis    and kyphoscoliosis   Insomnia    Osteopenia    Pulmonary embolism (HCC)    Squamous cell carcinoma in situ 03/12/2021   left medial shoulder, EDC   Thyroid disease    Transient disorder of initiating or maintaining sleep    Past Surgical History:  Procedure Laterality Date   amputation 2nd toe     APPENDECTOMY     ESOPHAGOGASTRODUODENOSCOPY (EGD) WITH PROPOFOL N/A 08/01/2018   Procedure: ESOPHAGOGASTRODUODENOSCOPY (EGD) WITH PROPOFOL;  Surgeon: Virgel Manifold, MD;  Location: [facility] ENDOSCOPY;  Service: Endoscopy;  Laterality: N/A;   ESOPHAGOGASTRODUODENOSCOPY (EGD) WITH PROPOFOL N/A 09/30/2020   Procedure: ESOPHAGOGASTRODUODENOSCOPY (EGD) WITH PROPOFOL;  Surgeon: Lesly Rubenstein, MD;  Location: [facility] ENDOSCOPY;  Service: Endoscopy;  Laterality: N/A;  PREFERS AFTERNOON   REPAIR ZENKER'S DIVERTICULA      TONSILLECTOMY AND ADENOIDECTOMY     TUBAL LIGATION     Social History   Socioeconomic History   Marital status: Married    Spouse name: Iona Beard   Number of children: 2   Years of education: Not on file   Highest education level: Not on file  Occupational History   Not on file  Tobacco Use   Smoking status: Former    Packs/day: 1.00    Years: 10.00    Pack years: 10.00    Types: Cigarettes    Quit date: 11/16/1961    Years since quitting: 59.4   Smokeless tobacco: Never  Vaping Use   Vaping Use: Never used  Substance and Sexual Activity   Alcohol use: Yes    Alcohol/week: 4.0 standard drinks    Types: 4 Cans of beer per week   Drug use: No   Sexual activity: Never  Other Topics Concern   Not on file  Social History Narrative   Not on file   Social Determinants of Health   Financial Resource Strain: Low Risk    Difficulty of Paying Living Expenses: Not hard at all  Food Insecurity: No Food Insecurity   Worried About Charity fundraiser in the Last Year: Never true   [place] in the Last Year: Never true  Transportation Needs: No Transportation Needs   Lack of Transportation (Medical): No   Lack of Transportation (Non-Medical): No  Physical Activity: Sufficiently Active   Days of Exercise per Week: 5 days   Minutes of Exercise per Session: 30 min  Stress: No Stress Concern Present   Feeling of Stress : Not at all  Social Connections: Socially Integrated   Frequency of Communication with Friends and Family: More than three times a week   Frequency of Social Gatherings with Friends and Family: More than three times a week   Attends Religious Services: More than 4 times per year   Active Member of [employer] or Organizations: Yes   Attends Music therapist: More than 4 times per year   Marital Status: Married  Human resources officer Violence: Not At Risk   Fear of Current or Ex-Partner: No   Emotionally Abused: No   Physically Abused: No   Sexually  Abused: No   Allergies  Allergen Reactions   Doxycycline Other (See Comments)   Moxifloxacin Other (See Comments)    tendonitis   Atenolol     Dizziness    Avelox [Moxifloxacin Hcl In Nacl]    Clindamycin Rash   Clindamycin/Lincomycin Rash   Latex Rash   Nystatin Rash   Penicillins Rash    Last set of Vital Signs (not current) Vitals:   18-Apr-2021 1800 04/18/21 1839  BP: 105/85 (!) 89/70  Pulse: (!) 134 (!) 126  Resp: (!) 21 (!) 27  Temp:    SpO2: 94% 94%      Physical Exam  Gen: unresponsive Cardiovascular: pulseless  Resp: apneic. Breath sounds equal bilaterally with bagging  Abd: nondistended  Neuro: GCS 3, unresponsive to pain  HEENT: No blood in posterior pharynx, gag reflex absent  Neck: No crepitus  Musculoskeletal: No deformity  Skin: warm  Procedures (when applicable, including Critical Care time): CPR  Date/Time: 04-18-2021 7:30 PM Performed by: Vanessa Rodanthe, MD Authorized by: Vanessa [place], MD  CPR Procedure Details:    ACLS/BLS initiated by EMS: Yes     CPR/ACLS performed in the ED: Yes     Duration of CPR (minutes):  10   Outcome: Pt declared dead    CPR performed via ACLS guidelines under my direct supervision.  See RN documentation for details including defibrillator use, medications, doses and timing.   MDM / Assessment and Plan Patient was unresponsive when I got to the room.  ACLS driven CPR was initiated.  Patient had a yellow DNR form in the room but patient was full code on the chart therefore while compressions were going on we are setting up for intubation I did call the husband Mr. Kushman who did state that patient would not want to be intubated and would want [place] to hold off on compressions therefore patient was made DNR and next pulse check at 1922 time of death was called.  Pupils were unresponsive patient had no pulse no respirations and therefore was declared deceased.     Vanessa Winters, MD Apr 18, 2021 [phone]

## 2021-04-19 NOTE — ED Notes (Signed)
Cardiac alarm heard from monitor with v-tach label showing as rhythm. Blood pressure reassessed on monitor from nurses station and no blood pressure was found. This nurse immediately went to check on pt . Pt seen lying on stretcher with eyes closed and mouth partly open so this nurse called for help. Pt found to be unresponsive with no pulse or respiratory effort noted upon initial assessment. Charge nurse arrived and confirmed and MD notified as initial supportive care began.

## 2021-04-19 NOTE — Code Documentation (Signed)
Patient time of death occurred at 22.

## 2021-04-19 DEATH — deceased

## 2021-05-21 ENCOUNTER — Ambulatory Visit: Payer: Medicare Other | Admitting: Podiatry

## 2021-09-02 ENCOUNTER — Ambulatory Visit: Payer: Medicare Other | Admitting: Dermatology

## 2021-09-23 ENCOUNTER — Ambulatory Visit: Payer: Medicare Other | Admitting: Dermatology

## 2022-03-05 ENCOUNTER — Ambulatory Visit (INDEPENDENT_AMBULATORY_CARE_PROVIDER_SITE_OTHER): Payer: Medicare Other | Admitting: Vascular Surgery

## 2022-08-23 IMAGING — DX DG CHEST 1V PORT
1 series · 1 of 1 positions shown · non-contrast
Comparison: 02/27/2020.

CLINICAL DATA: Shortness of breath.

EXAM:
PORTABLE CHEST 1 VIEW

[chest ap]
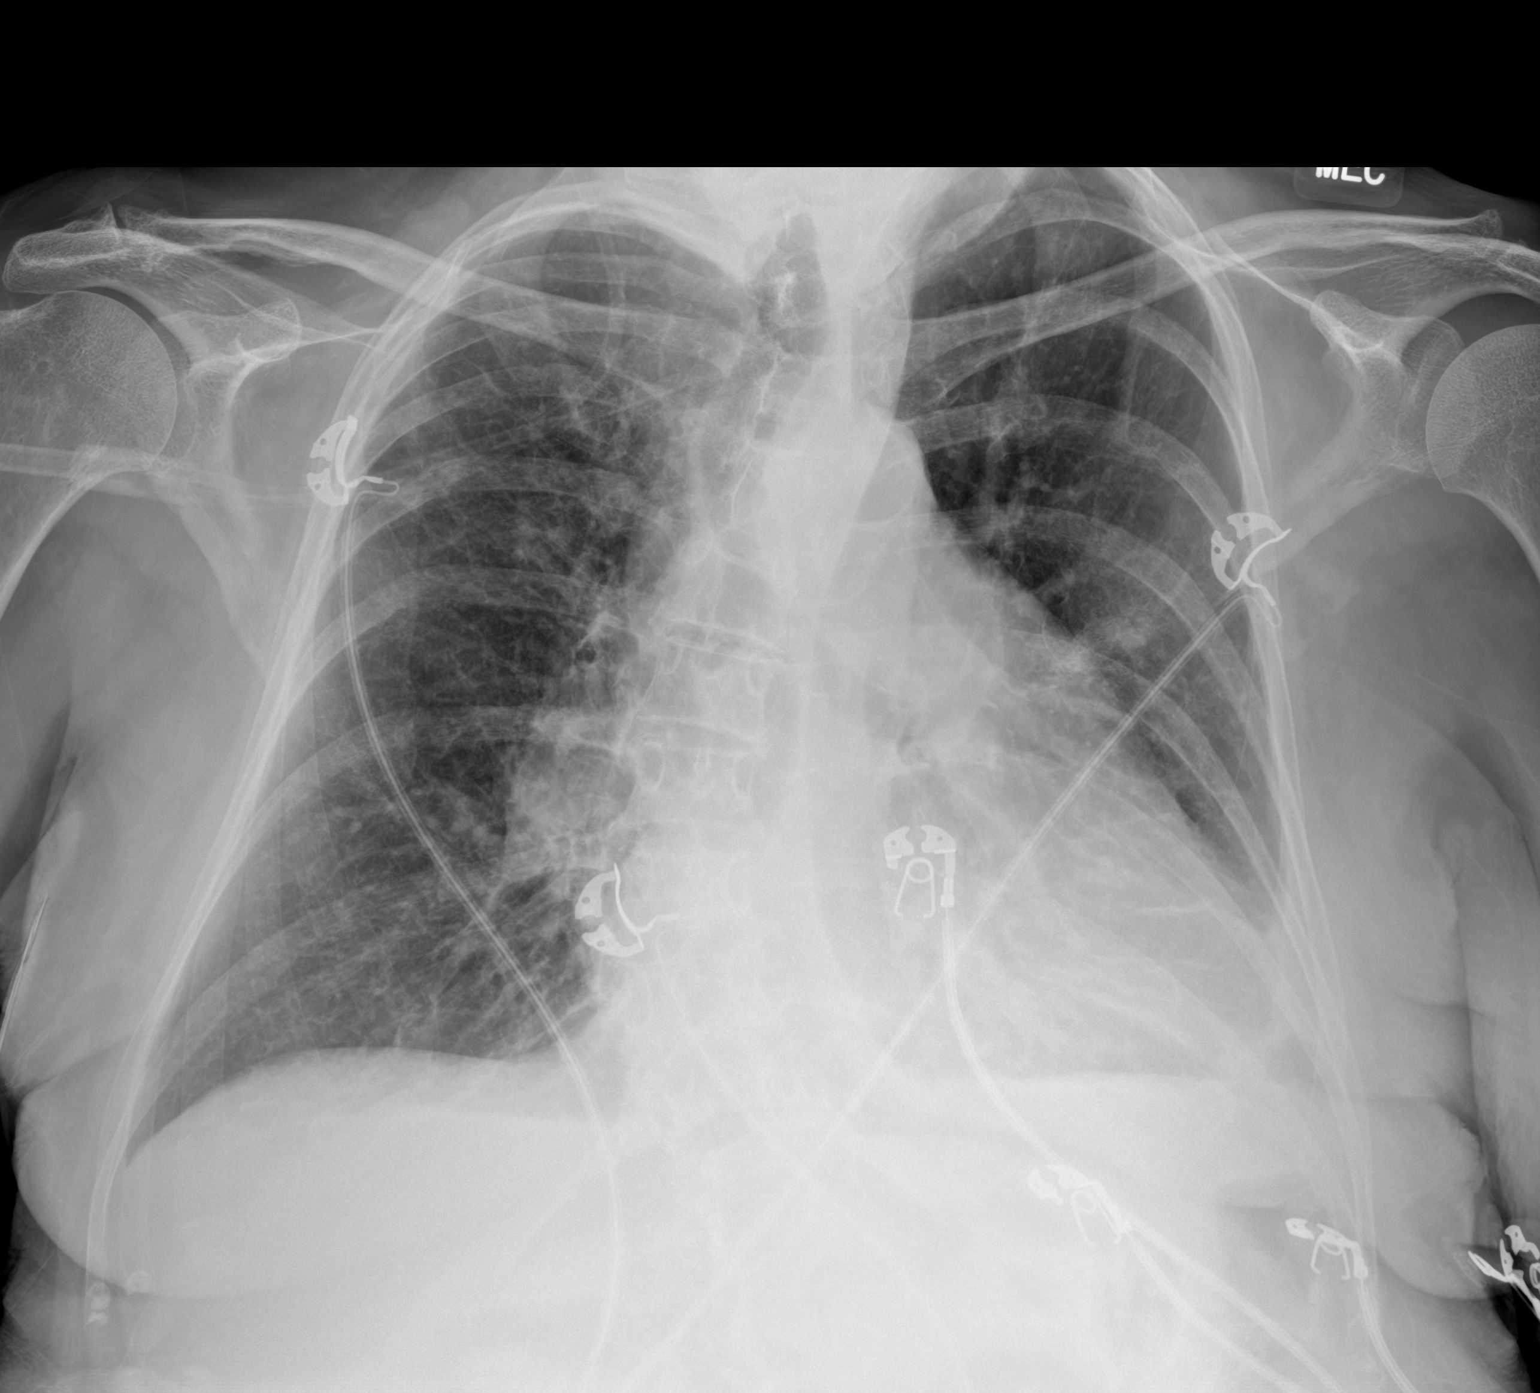

[1 of 1 positions shown; findings below may reference images not displayed]

FINDINGS: Small left pleural effusion. Streaky left basilar opacities,
similar. No confluent consolidation. No visible pneumothorax. Mild
enlargement the cardiac silhouette. Degenerative changes of the
spine with S shaped thoracolumbar curvature.
IMPRESSION: 1. Small left pleural effusion.
2. Similar streaky left basilar opacities, most likely
atelectasis/scar.
3. Cardiomegaly.
